# Patient Record
Sex: Female | Born: 1980 | Race: White | Hispanic: No | Marital: Married | Smoking: Never smoker
Health system: Southern US, Community
[De-identification: ages and names within clinical notes are randomized; demographics above are authoritative.]

## PROBLEM LIST (undated history)

## (undated) DIAGNOSIS — F32A Depression, unspecified: Secondary | ICD-10-CM

## (undated) DIAGNOSIS — I509 Heart failure, unspecified: Secondary | ICD-10-CM

## (undated) DIAGNOSIS — F329 Major depressive disorder, single episode, unspecified: Secondary | ICD-10-CM

## (undated) DIAGNOSIS — Z87898 Personal history of other specified conditions: Secondary | ICD-10-CM

---
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
## 2000-03-01 ENCOUNTER — Emergency Department (HOSPITAL_COMMUNITY): Admission: EM | Admit: 2000-03-01 | Discharge: 2000-03-01 | Payer: Self-pay | Admitting: Emergency Medicine

## 2003-11-19 ENCOUNTER — Emergency Department (HOSPITAL_COMMUNITY): Admission: EM | Admit: 2003-11-19 | Discharge: 2003-11-19 | Payer: Self-pay | Admitting: Emergency Medicine

## 2005-12-19 ENCOUNTER — Emergency Department (HOSPITAL_COMMUNITY): Admission: EM | Admit: 2005-12-19 | Discharge: 2005-12-19 | Payer: Self-pay | Admitting: Emergency Medicine

## 2006-04-20 ENCOUNTER — Inpatient Hospital Stay (HOSPITAL_COMMUNITY): Admission: AD | Admit: 2006-04-20 | Discharge: 2006-04-20 | Payer: Self-pay | Admitting: Obstetrics & Gynecology

## 2006-05-21 ENCOUNTER — Emergency Department (HOSPITAL_COMMUNITY): Admission: EM | Admit: 2006-05-21 | Discharge: 2006-05-21 | Payer: Self-pay | Admitting: Emergency Medicine

## 2006-06-30 ENCOUNTER — Ambulatory Visit: Payer: Self-pay | Admitting: Infectious Diseases

## 2006-06-30 ENCOUNTER — Inpatient Hospital Stay (HOSPITAL_COMMUNITY): Admission: AD | Admit: 2006-06-30 | Discharge: 2006-07-02 | Payer: Self-pay | Admitting: Obstetrics & Gynecology

## 2006-11-05 ENCOUNTER — Ambulatory Visit: Payer: Self-pay | Admitting: *Deleted

## 2006-11-05 ENCOUNTER — Inpatient Hospital Stay (HOSPITAL_COMMUNITY): Admission: AD | Admit: 2006-11-05 | Discharge: 2006-11-05 | Payer: Self-pay | Admitting: Obstetrics & Gynecology

## 2006-12-12 ENCOUNTER — Inpatient Hospital Stay (HOSPITAL_COMMUNITY): Admission: AD | Admit: 2006-12-12 | Discharge: 2006-12-17 | Payer: Self-pay | Admitting: Obstetrics and Gynecology

## 2006-12-12 ENCOUNTER — Ambulatory Visit: Payer: Self-pay | Admitting: Obstetrics & Gynecology

## 2007-12-20 ENCOUNTER — Ambulatory Visit: Payer: Self-pay | Admitting: Gastroenterology

## 2008-03-08 ENCOUNTER — Emergency Department (HOSPITAL_COMMUNITY): Admission: EM | Admit: 2008-03-08 | Discharge: 2008-03-08 | Payer: Self-pay | Admitting: Emergency Medicine

## 2009-01-27 ENCOUNTER — Ambulatory Visit: Payer: Self-pay | Admitting: Family Medicine

## 2009-01-27 DIAGNOSIS — L659 Nonscarring hair loss, unspecified: Secondary | ICD-10-CM | POA: Insufficient documentation

## 2009-01-27 DIAGNOSIS — R5383 Other fatigue: Secondary | ICD-10-CM

## 2009-01-27 DIAGNOSIS — R42 Dizziness and giddiness: Secondary | ICD-10-CM | POA: Insufficient documentation

## 2009-01-27 DIAGNOSIS — R5381 Other malaise: Secondary | ICD-10-CM | POA: Insufficient documentation

## 2009-01-28 LAB — CONVERTED CEMR LAB
Basophils Relative: 0.1 % (ref 0.0–3.0)
Eosinophils Relative: 0.8 % (ref 0.0–5.0)
HCT: 40.1 % (ref 36.0–46.0)
Lymphs Abs: 1.6 10*3/uL (ref 0.7–4.0)
MCV: 88.2 fL (ref 78.0–100.0)
Monocytes Absolute: 0.6 10*3/uL (ref 0.1–1.0)
Neutrophils Relative %: 71.3 % (ref 43.0–77.0)
RBC: 4.55 M/uL (ref 3.87–5.11)
WBC: 7.6 10*3/uL (ref 4.5–10.5)

## 2009-02-15 ENCOUNTER — Other Ambulatory Visit: Admission: RE | Admit: 2009-02-15 | Discharge: 2009-02-15 | Payer: Self-pay | Admitting: Family Medicine

## 2009-02-15 ENCOUNTER — Encounter: Payer: Self-pay | Admitting: Family Medicine

## 2009-02-15 ENCOUNTER — Ambulatory Visit: Payer: Self-pay | Admitting: Family Medicine

## 2009-02-15 DIAGNOSIS — IMO0002 Reserved for concepts with insufficient information to code with codable children: Secondary | ICD-10-CM | POA: Insufficient documentation

## 2010-10-24 ENCOUNTER — Encounter: Payer: Self-pay | Admitting: Family Medicine

## 2011-02-17 NOTE — Discharge Summary (Signed)
Sherry Shaw, Sherry Shaw                ACCOUNT NO.:  [id number]   MEDICAL RECORD NO.:  [id number]          PATIENT TYPE:  INP   LOCATION:  9133                          FACILITY:  [facility]   PHYSICIAN:  Phil D. Okey Dupre, M.D.     DATE OF BIRTH:  07-27-1981   DATE OF ADMISSION:  12/12/2006  DATE OF DISCHARGE:  12/17/2006                               DISCHARGE SUMMARY   The patient was admitted on December 12, 2006 at 33 weeks' gestation as a  primigravida, G1, P1 at 65 week's and 0 days. A 30 year old A1  gestational diabetic for induction of labor. At that time, the patient  had been seen. Prenatal care had been received with Dr. Shawnie Pons at [facility]. However, the patient presented as  transfer of care to [facility] for delivery on December 12, 2006 at  the request of Dr. Shawnie Pons. Cervidil induction was began on December 13, 2006,  and at that time, the patient's fetal heart rate was 150s, reassuring,  and the patient was not having contractions. The patient was given  several doses of Cervidil and progressed to 2 cm dilation at which time  on hospital day #2 Pitocin was started. On hospital day #2, the patient  progressed to complete dilation and began pushing. After approximately 1-  1/2 hours of pushing without descent, decision was made to proceed with  a primary low transverse cesarean section for failure to progress, and  low transverse cesarean section was performed by Dr. Marice Potter. Please see  dictated operative note for details of that surgery. Estimated blood  loss was 600 mL. A live female was birthed with Apgars of 9 and 9 at one  and five minutes, respectively, weighing 8 pounds and 9 ounces. The  patient had an uneventful postoperative course and remained afebrile,  and vital signs were stable throughout. The patient continued to normal  exam throughout her stay and no spikes in temperature or labs. The  patient was discharged on hospital day #3. The patient was  discharged on  hospital day #3. Date December 17, 2006 with a normal exam. The patient's  blood sugars and blood pressures remained stable throughout her hospital  course. Discharge labs included a hemoglobin of 7.8, hematocrit of 24.5,  white blood cell count of 12.8, and a platelet count of 295. Of note, at  the time of discharge, a mild cellulitis was observed of patient's  incision line, and the patient was started on Keflex 500 mg b.i.d. for  10 days. At the time of discharge, the patient was a 30 year old G1, P72-  0-0-1 status post primary low transverse cesarean section secondary to  arrest of descent who had a female infant who was grossly normal and was  discharged home with mother. The patient was breast and bottle feeding  and was found to be rubella immune. The patient was immunized against  rubella before discharge. The patient's choice of contraceptive was Depo  which she received 150 mg IM injection prior to discharge from the  hospital.   The patient's discharge prescriptions included:  1. Ibuprofen 600 mg p.o. q.6h. p.r.n. pain.  2. Percocet 3 and 325 one to two tablets q.4-6h. p.r.n. pain.  3. Keflex 500 mg p.o. b.i.d. for 10 days.   Maternal care coordinator will discontinue the staples on postpartum day  #5.      Maylon Cos, C.N.M.    ______________________________  Javier Glazier Okey Dupre, M.D.   SS/MEDQ  D:  02/06/2007  T:  02/06/2007  Job:  [id number]

## 2011-02-17 NOTE — Discharge Summary (Signed)
NAMEAMALA, Sherry Shaw                ACCOUNT NO.:  [id number]   MEDICAL RECORD NO.:  [id number]          PATIENT TYPE:  INP   LOCATION:  9307                          [facility]ILITY[facility]  [facility]   PHYSICIAN:  Lesly Dukes, M.D. DATE OF BIRTH:  08/18/1981   DATE OF ADMISSION:  06/30/2006  DATE OF DISCHARGE:  07/02/2006                                 DISCHARGE SUMMARY   ADMISSION DIAGNOSES:  1. Dehydration.  2. Viral exanthem.   DISCHARGE DIAGNOSES:  1. Dehydration.  2. Viral exanthem.   PROCEDURE:  None.   CONSULTATIONS:  Infectious disease.   HISTORY OF PRESENT ILLNESS:  The patient is a 30 year old, gravida 1 para 0,  at 65 weeks' gestation who presented with dizziness for two days prior to  admission.  The patient also complained of nausea, vomiting, some abdominal  cramping, denies any dysuria, fevers, diarrhea, complains of a generalized  pruritic rash to neck, chest, back, arms x1 week.  The patient receives  prenatal care in [facility].   OUTPATIENT MEDICATIONS:  Prenatal vitamins and Tylenol.   ALLERGIES:  NO KNOWN DRUG ALLERGIES.   PAST MEDICAL HISTORY:   OBSTETRICAL HISTORY:  The patient is a primigravida.   GYNECOLOGIC HISTORY:  No history of STDs.   MEDICAL HISTORY:  Denies any medical problems.   SURGICAL HISTORY:  Denies any surgeries.   SOCIAL HISTORY:  Denies any smoking, alcohol, or drug use.   PHYSICAL EXAMINATION:  VITAL SIGNS:  Included a temperature of 98.6, pulse  104, respirations 22, blood pressure 107/67.  GENERAL:  She appeared in no apparent distress, poor pallor, pasty-  appearing.  HEENT:  Significant for anterior and posterior cervical lymphadenopathy.  LUNGS:  Clear to auscultation bilaterally.  NECK:  There was no nuchal rigidity or stiffness.  HEART:  Regular rate and rhythm.  ABDOMEN:  Soft.  She has mild generalized tenderness.  No rebound or  guarding.  SKIN:  There is a diffuse papular rash on chest, back, upper arms.  EXTREMITIES:   No edema.  Full range of motion.   ADMISSION LABORATORY:  Included a white count of 16.2, hemoglobin 11.8,  platelets 388.  Glucose 51, potassium 3.4, creatinine 0.4.  ALT 17, AST 19.   The patient was admitted with dehydration and viral exanthem.  Infectious  disease was consulted.  The patient was admitted for observation, started on  IV fluids, antiemetics, and Benadryl p.r.n. for her rash.   HOSPITAL COURSE:  Infectious disease saw the patient, thought most likely  viral exanthem.  CMV, toxo, EBV titers were sent.  The patient's dehydration  improved.  By hospital day one, she denied any nausea, vomiting, no  dizziness.  Abdominal exam was improved.  Her toxo titers came back negative  both IgG and IgM.  The patient still had significant itching, was switched  to Vistaril with improvement.  The patient was discharged in stable  condition on hospital day two.   The patient was discharged on the following medication, hydroxyzine 25 mg  p.o. q.6 h. p.r.n. itching.   DIET:  Regular.   ACTIVITY:  No restrictions.   The patient to follow up with her OB doctor in [facility], has an  appointment scheduled on July 10, 2006.   At the time of discharge both CMV and EBV titers were still pending.  The  patient to have private physician follow up on results.     ______________________________  Paticia Stack, MD    ______________________________  Lesly Dukes, M.D.    LNJ/MEDQ  D:  07/17/2006  T:  07/18/2006  Job:  [id number]   cc:   patient's private physician  [place], [place]

## 2011-02-17 NOTE — Op Note (Signed)
Sherry Shaw, Sherry Shaw                ACCOUNT NO.:  [id number]   MEDICAL RECORD NO.:  [id number]          PATIENT TYPE:  INP   LOCATION:  9133                          [facility]ILITY[facility]  [facility]   PHYSICIAN:  Paticia Stack, MD     DATE OF BIRTH:  18-Aug-1981   DATE OF PROCEDURE:  12/14/2006  DATE OF DISCHARGE:                               OPERATIVE REPORT   PREOPERATIVE DIAGNOSIS:  Intrauterine pregnancy at 75 weeks' gestation,  arrest of labor, gestational diabetes.   POSTOPERATIVE DIAGNOSIS:  Intrauterine pregnancy at 38 weeks' gestation,  arrest of labor, gestational diabetes.   PROCEDURE:  Primary low transverse cesarean section.   SURGEON:  Dr. Nicholaus Bloom and Dr. Sherron Monday.   ASSISTANT:  Dr. Wilburt Finlay.   ANESTHESIA:  Epidural and local.   SPECIMEN:  Placenta sent to L&D.   ESTIMATED BLOOD LOSS:  600 mL.   COMPLICATIONS:  None.   FINDINGS:  Living female infant, Apgars 9 and 9 at one and five minutes,  respectively.  Birth weight 8 pounds 9 ounces.   INDICATIONS FOR PROCEDURE:  This is a 30 year old gravida 1 at 85 weeks'  gestation with history of gestational diabetes who was admitted for  induction of labor secondary to gestational diabetes and postdates.  The  patient progressed to complete and pushing for greater than 1-1/2 hours  with no descent from +1 station.  The patient was taken for primary low  transverse cesarean section secondary to arrest of descent.   PROCEDURE:  The patient was taken to the operating room where her  epidural anesthesia was found to be adequate.  She was then prepped and  draped in a normal sterile fashion in dorsal supine position with a  leftward tilt.  Pfannenstiel skin incision was then made with a scalpel  and carried through to the underlying layer of fascia.  Fascia was  incised in the midline, and the incision extended laterally with Mayo  scissors.  The superior aspect of the fascial incision was then grasped  with the Kocher clamps,  elevated and the underlying rectus muscles  dissected off bluntly.  Attention was then turned to the inferior aspect  of the incision which in a similar fashion was grasped, tented up with  the Kocher clamps and the rectus muscles dissected off bluntly.  The  rectus muscles were then separated in the midline and the peritoneum  identified, tented up and entered sharply with the Metzenbaum scissors.  The peritoneal incision was then extended superiorly and inferiorly with  good visualization of the bladder.  The bladder blade was then inserted  and the vesicouterine peritoneum identified, grasped with the pickups  and entered sharply with the Metzenbaum scissors.  This incision was  then extended laterally and the bladder flap created digitally.  The  bladder blade was then reinserted and the lower uterine segment incised  in a transverse fashion with a scalpel.  The bladder blade was removed.  The infant was found to be in the OP position, and the infant's head was  delivered atraumatically.  The nose and mouth were  suctioned and the  cord clamped and cut.  The infant was handed off to the waiting  pediatrician.  The placenta was then removed manually.  The uterus  cleared of all clots and debris, and the uterine incision was repaired  with 0 Monocryl in a running locked fashion.  The uterine incision was  inspected several times with good hemostasis noted.  The gutters were  cleared of all clots, and the fascia was reapproximated with 0 Vicryl in  a running fashion.  The skin was closed with staples.  The patient  tolerated the procedure well.  Sponge, lap and needle counts were  correct x2.  Two grams of Ancef was given at cord clamp.  The patient  was taken to the recovery room in stable condition.           ______________________________  Paticia Stack, MD     LNJ/MEDQ  D:  12/14/2006  T:  12/15/2006  Job:  [id number]

## 2011-06-29 LAB — POCT I-STAT, CHEM 8
BUN: 11
Calcium, Ion: 1.14
Chloride: 105
Creatinine, Ser: 0.7
Glucose, Bld: 96
HCT: 40
Hemoglobin: 13.6
Potassium: 3.7
Sodium: 138
TCO2: 23

## 2011-07-04 ENCOUNTER — Emergency Department (HOSPITAL_COMMUNITY)
Admission: EM | Admit: 2011-07-04 | Discharge: 2011-07-04 | Disposition: A | Discharge status: Home / Self Care | Payer: 59 | Attending: Emergency Medicine | Admitting: Emergency Medicine

## 2011-07-04 DIAGNOSIS — R040 Epistaxis: Secondary | ICD-10-CM | POA: Insufficient documentation

## 2012-04-10 NOTE — H&P (Signed)
Sherry Shaw  DICTATION # [id number] CSN# [id number]   Meriel Pica, MD 04/10/2012 1:03 PM

## 2012-04-11 NOTE — H&P (Signed)
NAMEPAYTON, MODER NO.:  [id number]  MEDICAL RECORD NO.:  [id number]  LOCATION:                                 FACILITY:  PHYSICIAN:  Duke Salvia. Marcelle Overlie, M.D.DATE OF BIRTH:  [birth year]  DATE OF ADMISSION: DATE OF DISCHARGE:                             HISTORY & PHYSICAL   CHIEF COMPLAINT:  Pelvic pain, dyspareunia.  HISTORY OF PRESENT ILLNESS:  A 31 year old, G2, P1, currently using Depo- Provera for contraception.  She has a 75-month history of insertional and deep dyspareunia that has worsened during that time frame.  Ultrasound on Mar 01, 2012, in our office showed a retroverted uterus with no other significant abnormalities.  She has required Vicoprofen for relief of her pain, and presents now for diagnostic laparoscopy to further evaluate.  This procedure including specific risks related to bleeding, infection, adjacent organ injury, the possible need for open additional surgery discussed with her.  The laparoscopic treatment of endometriosis, lysis of adhesions was discussed with her also.  PAST MEDICAL HISTORY:  Allergies to AMPICILLIN.  CURRENT MEDICATIONS:  Wellbutrin, Prozac, Vicoprofen p.r.n.  SURGICAL HISTORY:  Cesarean section in 2008.  REVIEW OF SYSTEMS:  Significant for headache, anemia, UTI.  FAMILY HISTORY:  Significant for asthma, arthritis, diabetes, and lung and throat cancer.  SOCIAL HISTORY:  Denies drug or tobacco use.  She does admit to social alcohol use.  She is married.  PHYSICAL EXAMINATION:  VITAL SIGNS:  Temp 98.2, blood pressure 120/60. HEENT:  Unremarkable. NECK:  Supple without masses. LUNGS:  Clear. CARDIOVASCULAR:  Regular rate and rhythm without murmurs, rubs, or gallops. BREASTS:  Without masses. ABDOMEN:  Soft, flat, nontender. PELVIC:  Normal external genitalia.  Vagina and cervix clear.  Uterus, mid position, normal size, mildly tender.  No unusual nodularity. Adnexa negative. EXTREMITIES:   Unremarkable. NEUROLOGIC:  Unremarkable.  IMPRESSION:  Dyspareunia, rule out endometriosis, adhesions or other causes of pelvic pain.  PLAN:  Diagnostic laparoscopy.  Procedure and risks discussed as above.     Jonise Weightman M. Marcelle Overlie, M.D.     RMH/MEDQ  D:  04/10/2012  T:  04/11/2012  Job:  [id number]

## 2012-04-12 ENCOUNTER — Encounter (HOSPITAL_COMMUNITY): Payer: Self-pay | Admitting: Pharmacist

## 2012-04-16 ENCOUNTER — Encounter (HOSPITAL_COMMUNITY): Payer: Self-pay

## 2012-04-16 ENCOUNTER — Encounter (HOSPITAL_COMMUNITY)
Admission: RE | Admit: 2012-04-16 | Discharge: 2012-04-16 | Disposition: A | Discharge status: Home / Self Care | Payer: 59 | Source: Ambulatory Visit | Attending: Obstetrics and Gynecology | Admitting: Obstetrics and Gynecology

## 2012-04-16 HISTORY — DX: Major depressive disorder, single episode, unspecified: F32.9

## 2012-04-16 HISTORY — DX: Personal history of other specified conditions: Z87.898

## 2012-04-16 HISTORY — DX: Depression, unspecified: F32.A

## 2012-04-16 LAB — CBC
HCT: 39.5 % (ref 36.0–46.0)
Hemoglobin: 13 g/dL (ref 12.0–15.0)
MCHC: 32.9 g/dL (ref 30.0–36.0)
RBC: 4.61 MIL/uL (ref 3.87–5.11)
WBC: 9.4 10*3/uL (ref 4.0–10.5)

## 2012-04-16 LAB — SURGICAL PCR SCREEN: Staphylococcus aureus: POSITIVE — AB

## 2012-04-16 NOTE — Patient Instructions (Signed)
YOUR PROCEDURE IS SCHEDULED ON:04/25/12  ENTER THROUGH THE MAIN ENTRANCE OF [facility] AT:6am  USE DESK PHONE AND DIAL [phone] TO INFORM us OF YOUR ARRIVAL  CALL [phone] IF YOU HAVE ANY QUESTIONS OR PROBLEMS PRIOR TO YOUR ARRIVAL.  REMEMBER: DO NOT EAT OR DRINK AFTER MIDNIGHT : Wed   YOU MAY BRUSH YOUR TEETH THE MORNING OF SURGERY   TAKE THESE MEDICINES THE DAY OF SURGERY WITH SIP OF WATER:none   DO NOT WEAR JEWELRY, EYE MAKEUP, LIPSTICK OR DARK FINGERNAIL POLISH DO NOT WEAR LOTIONS  DO NOT SHAVE FOR 48 HOURS PRIOR TO SURGERY  YOU WILL NOT BE ALLOWED TO DRIVE YOURSELF HOME.

## 2012-04-18 MED ORDER — CEFAZOLIN SODIUM-DEXTROSE 2-3 GM-% IV SOLR
INTRAVENOUS | Status: AC
  Filled 2012-04-18: qty 50

## 2012-04-24 MED ORDER — CIPROFLOXACIN IN D5W 400 MG/200ML IV SOLN
400.0000 mg | INTRAVENOUS | Status: AC
  Administered 2012-04-25: 400 mg via INTRAVENOUS
  Filled 2012-04-24: qty 200

## 2012-04-24 MED ORDER — METRONIDAZOLE IN NACL 5-0.79 MG/ML-% IV SOLN
500.0000 mg | INTRAVENOUS | Status: AC
  Administered 2012-04-25: .5 g via INTRAVENOUS
  Filled 2012-04-24: qty 100

## 2012-04-25 ENCOUNTER — Ambulatory Visit (HOSPITAL_COMMUNITY)
Admission: RE | Admit: 2012-04-25 | Discharge: 2012-04-25 | Disposition: A | Discharge status: Home / Self Care | Payer: 59 | Source: Ambulatory Visit | Attending: Obstetrics and Gynecology | Admitting: Obstetrics and Gynecology

## 2012-04-25 ENCOUNTER — Encounter (HOSPITAL_COMMUNITY): Payer: Self-pay | Admitting: *Deleted

## 2012-04-25 ENCOUNTER — Encounter (HOSPITAL_COMMUNITY): Payer: Self-pay | Admitting: Anesthesiology

## 2012-04-25 ENCOUNTER — Encounter (HOSPITAL_COMMUNITY)
Admission: RE | Disposition: A | Discharge status: Home / Self Care | Payer: Self-pay | Source: Ambulatory Visit | Attending: Obstetrics and Gynecology

## 2012-04-25 ENCOUNTER — Ambulatory Visit (HOSPITAL_COMMUNITY): Payer: 59 | Admitting: Anesthesiology

## 2012-04-25 DIAGNOSIS — Z01818 Encounter for other preprocedural examination: Secondary | ICD-10-CM | POA: Insufficient documentation

## 2012-04-25 DIAGNOSIS — Z01812 Encounter for preprocedural laboratory examination: Secondary | ICD-10-CM | POA: Insufficient documentation

## 2012-04-25 DIAGNOSIS — N949 Unspecified condition associated with female genital organs and menstrual cycle: Secondary | ICD-10-CM | POA: Insufficient documentation

## 2012-04-25 DIAGNOSIS — IMO0002 Reserved for concepts with insufficient information to code with codable children: Secondary | ICD-10-CM | POA: Insufficient documentation

## 2012-04-25 HISTORY — PX: LAPAROSCOPY: SHX197

## 2012-04-25 SURGERY — LAPAROSCOPY, DIAGNOSTIC
Anesthesia: General | Site: Abdomen | Wound class: Clean

## 2012-04-25 MED ORDER — NEOSTIGMINE METHYLSULFATE 1 MG/ML IJ SOLN
INTRAMUSCULAR | Status: AC
  Filled 2012-04-25: qty 10

## 2012-04-25 MED ORDER — MIDAZOLAM HCL 2 MG/2ML IJ SOLN
INTRAMUSCULAR | Status: AC
  Filled 2012-04-25: qty 2

## 2012-04-25 MED ORDER — BUPIVACAINE HCL (PF) 0.25 % IJ SOLN
INTRAMUSCULAR | Status: DC | PRN
  Administered 2012-04-25: 8 mL

## 2012-04-25 MED ORDER — FENTANYL CITRATE 0.05 MG/ML IJ SOLN
INTRAMUSCULAR | Status: AC
  Filled 2012-04-25: qty 5

## 2012-04-25 MED ORDER — FENTANYL CITRATE 0.05 MG/ML IJ SOLN
INTRAMUSCULAR | Status: DC | PRN
  Administered 2012-04-25: 100 ug via INTRAVENOUS
  Administered 2012-04-25: 150 ug via INTRAVENOUS

## 2012-04-25 MED ORDER — ROCURONIUM BROMIDE 50 MG/5ML IV SOLN
INTRAVENOUS | Status: AC
  Filled 2012-04-25: qty 1

## 2012-04-25 MED ORDER — SODIUM CHLORIDE 0.9 % IJ SOLN
INTRAMUSCULAR | Status: DC | PRN
  Administered 2012-04-25: 10 mL

## 2012-04-25 MED ORDER — KETOROLAC TROMETHAMINE 30 MG/ML IJ SOLN
INTRAMUSCULAR | Status: AC
  Administered 2012-04-25: 30 mg via INTRAVENOUS
  Filled 2012-04-25: qty 1

## 2012-04-25 MED ORDER — ONDANSETRON HCL 4 MG/2ML IJ SOLN
INTRAMUSCULAR | Status: AC
  Filled 2012-04-25: qty 2

## 2012-04-25 MED ORDER — FENTANYL CITRATE 0.05 MG/ML IJ SOLN
INTRAMUSCULAR | Status: AC
  Administered 2012-04-25: 50 ug via INTRAVENOUS
  Filled 2012-04-25: qty 2

## 2012-04-25 MED ORDER — LIDOCAINE HCL (CARDIAC) 20 MG/ML IV SOLN
INTRAVENOUS | Status: DC | PRN
  Administered 2012-04-25: 30 mg via INTRAVENOUS

## 2012-04-25 MED ORDER — HYDROCODONE-ACETAMINOPHEN 5-325 MG PO TABS
1.0000 | ORAL_TABLET | Freq: Once | ORAL | Status: AC
  Administered 2012-04-25: 1 via ORAL

## 2012-04-25 MED ORDER — KETOROLAC TROMETHAMINE 30 MG/ML IJ SOLN
30.0000 mg | Freq: Once | INTRAMUSCULAR | Status: AC
  Administered 2012-04-25: 30 mg via INTRAVENOUS

## 2012-04-25 MED ORDER — FENTANYL CITRATE 0.05 MG/ML IJ SOLN
25.0000 ug | INTRAMUSCULAR | Status: DC | PRN
  Administered 2012-04-25: 50 ug via INTRAVENOUS

## 2012-04-25 MED ORDER — PROPOFOL 10 MG/ML IV EMUL
INTRAVENOUS | Status: AC
  Filled 2012-04-25: qty 20

## 2012-04-25 MED ORDER — MIDAZOLAM HCL 5 MG/5ML IJ SOLN
INTRAMUSCULAR | Status: DC | PRN
  Administered 2012-04-25: 2 mg via INTRAVENOUS

## 2012-04-25 MED ORDER — LACTATED RINGERS IV SOLN
INTRAVENOUS | Status: DC
  Administered 2012-04-25 (×2): via INTRAVENOUS

## 2012-04-25 MED ORDER — DEXAMETHASONE SODIUM PHOSPHATE 10 MG/ML IJ SOLN
INTRAMUSCULAR | Status: AC
  Filled 2012-04-25: qty 1

## 2012-04-25 MED ORDER — DEXAMETHASONE SODIUM PHOSPHATE 10 MG/ML IJ SOLN
INTRAMUSCULAR | Status: DC | PRN
  Administered 2012-04-25: 10 mg via INTRAVENOUS

## 2012-04-25 MED ORDER — METOCLOPRAMIDE HCL 5 MG/ML IJ SOLN: 10.0000 mg | Freq: Once | INTRAMUSCULAR | Status: DC | PRN

## 2012-04-25 MED ORDER — HYDROMORPHONE HCL PF 1 MG/ML IJ SOLN: 0.2500 mg | INTRAMUSCULAR | Status: DC | PRN

## 2012-04-25 MED ORDER — PROPOFOL 10 MG/ML IV EMUL
INTRAVENOUS | Status: DC | PRN
  Administered 2012-04-25: 150 mg via INTRAVENOUS
  Administered 2012-04-25: 50 mg via INTRAVENOUS

## 2012-04-25 MED ORDER — NEOSTIGMINE METHYLSULFATE 1 MG/ML IJ SOLN
INTRAMUSCULAR | Status: DC | PRN
  Administered 2012-04-25: 4 mg via INTRAVENOUS

## 2012-04-25 MED ORDER — BUPIVACAINE HCL (PF) 0.25 % IJ SOLN
INTRAMUSCULAR | Status: AC
  Filled 2012-04-25: qty 30

## 2012-04-25 MED ORDER — DEXTROSE IN LACTATED RINGERS 5 % IV SOLN: INTRAVENOUS | Status: DC

## 2012-04-25 MED ORDER — GLYCOPYRROLATE 0.2 MG/ML IJ SOLN
INTRAMUSCULAR | Status: DC | PRN
  Administered 2012-04-25: .8 mg via INTRAVENOUS

## 2012-04-25 MED ORDER — ONDANSETRON HCL 4 MG/2ML IJ SOLN
INTRAMUSCULAR | Status: DC | PRN
  Administered 2012-04-25: 4 mg via INTRAVENOUS

## 2012-04-25 MED ORDER — GLYCOPYRROLATE 0.2 MG/ML IJ SOLN
INTRAMUSCULAR | Status: AC
  Filled 2012-04-25: qty 2

## 2012-04-25 MED ORDER — ROCURONIUM BROMIDE 100 MG/10ML IV SOLN
INTRAVENOUS | Status: DC | PRN
  Administered 2012-04-25: 30 mg via INTRAVENOUS

## 2012-04-25 MED ORDER — MEPERIDINE HCL 25 MG/ML IJ SOLN: 6.2500 mg | INTRAMUSCULAR | Status: DC | PRN

## 2012-04-25 MED ORDER — HYDROCODONE-ACETAMINOPHEN 5-325 MG PO TABS
ORAL_TABLET | ORAL | Status: AC
  Filled 2012-04-25: qty 1

## 2012-04-25 MED ORDER — LIDOCAINE HCL (CARDIAC) 20 MG/ML IV SOLN
INTRAVENOUS | Status: AC
  Filled 2012-04-25: qty 5

## 2012-04-25 SURGICAL SUPPLY — 31 items
ADH SKN CLS APL DERMABOND .7 (GAUZE/BANDAGES/DRESSINGS) ×2
BAG SPEC RTRVL LRG 6X4 10 (ENDOMECHANICALS)
CABLE HIGH FREQUENCY MONO STRZ (ELECTRODE) IMPLANT
CATH ROBINSON RED A/P 16FR (CATHETERS) ×3 IMPLANT
CLOTH BEACON ORANGE TIMEOUT ST (SAFETY) ×3 IMPLANT
DECANTER SPIKE VIAL GLASS SM (MISCELLANEOUS) ×4 IMPLANT
DERMABOND ADVANCED (GAUZE/BANDAGES/DRESSINGS) ×1
DERMABOND ADVANCED .7 DNX12 (GAUZE/BANDAGES/DRESSINGS) ×2 IMPLANT
GLOVE BIO SURGEON STRL SZ7 (GLOVE) ×6 IMPLANT
GLOVE BIOGEL PI IND STRL 6.5 (GLOVE) ×1 IMPLANT
GLOVE BIOGEL PI INDICATOR 6.5 (GLOVE) ×1
GLOVE NEODERM STER SZ 7 (GLOVE) ×2 IMPLANT
GOWN PREVENTION PLUS LG XLONG (DISPOSABLE) ×6 IMPLANT
NDL INSUFFLATION 14GA 120MM (NEEDLE) ×1 IMPLANT
NEEDLE INSUFFLATION 14GA 120MM (NEEDLE) ×3 IMPLANT
NS IRRIG 1000ML POUR BTL (IV SOLUTION) ×1 IMPLANT
PACK LAPAROSCOPY BASIN (CUSTOM PROCEDURE TRAY) ×3 IMPLANT
POUCH SPECIMEN RETRIEVAL 10MM (ENDOMECHANICALS) IMPLANT
PROTECTOR NERVE ULNAR (MISCELLANEOUS) ×3 IMPLANT
SEALER TISSUE G2 CVD JAW 35 (ENDOMECHANICALS) IMPLANT
SEALER TISSUE G2 CVD JAW 45CM (ENDOMECHANICALS) IMPLANT
SET IRRIG TUBING LAPAROSCOPIC (IRRIGATION / IRRIGATOR) IMPLANT
SUT MON AB 2-0 CT1 36 (SUTURE) ×3 IMPLANT
SUT VIC AB 2-0 CT1 18 (SUTURE) ×2 IMPLANT
SUT VICRYL 0 TIES 12 18 (SUTURE) ×1 IMPLANT
SUT VICRYL RAPIDE 4/0 PS 2 (SUTURE) ×6 IMPLANT
TOWEL OR 17X24 6PK STRL BLUE (TOWEL DISPOSABLE) ×6 IMPLANT
TROCAR Z-THREAD BLADED 11X100M (TROCAR) ×3 IMPLANT
TROCAR Z-THREAD BLADED 5X100MM (TROCAR) ×4 IMPLANT
WARMER LAPAROSCOPE (MISCELLANEOUS) ×3 IMPLANT
WATER STERILE IRR 1000ML POUR (IV SOLUTION) ×3 IMPLANT

## 2012-04-25 NOTE — Anesthesia Preprocedure Evaluation (Addendum)
Anesthesia Evaluation  Patient identified by MRN, date of birth, ID band Patient awake    Reviewed: Allergy & Precautions, H&P , NPO status , Patient's Chart, lab work & pertinent test results  Airway Mallampati: II TM Distance: >3 FB Neck ROM: Full    Dental No notable dental hx. (+) Chipped   Pulmonary neg pulmonary ROS,  breath sounds clear to auscultation  Pulmonary exam normal       Cardiovascular negative cardio ROS  Rhythm:Regular Rate:Normal     Neuro/Psych Depression negative neurological ROS     GI/Hepatic Neg liver ROS, GERD-  Medicated and Controlled,  Endo/Other  negative endocrine ROS  Renal/GU negative Renal ROS  negative genitourinary   Musculoskeletal negative musculoskeletal ROS (+)   Abdominal   Peds  Hematology negative hematology ROS (+)   Anesthesia Other Findings   Reproductive/Obstetrics negative OB ROS                          Anesthesia Physical Anesthesia Plan  ASA: II  Anesthesia Plan: General   Post-op Pain Management:    Induction: Intravenous  Airway Management Planned: Oral ETT  Additional Equipment:   Intra-op Plan:   Post-operative Plan: Extubation in OR  Informed Consent: I have reviewed the patients History and Physical, chart, labs and discussed the procedure including the risks, benefits and alternatives for the proposed anesthesia with the patient or authorized representative who has indicated his/her understanding and acceptance.   Dental advisory given  Plan Discussed with: CRNA, Anesthesiologist and Surgeon  Anesthesia Plan Comments:         Anesthesia Quick Evaluation

## 2012-04-25 NOTE — Transfer of Care (Signed)
Immediate Anesthesia Transfer of Care Note  Patient: Sherry Shaw  Procedure(s) Performed: Procedure(s) (LRB): LAPAROSCOPY DIAGNOSTIC (N/A)  Patient Location: PACU  Anesthesia Type: General  Level of Consciousness: awake  Airway & Oxygen Therapy: Patient Spontanous Breathing and Patient connected to nasal cannula oxygen  Post-op Assessment: Report given to PACU RN and Post -op Vital signs reviewed and stable  Post vital signs: stable  Complications: No apparent anesthesia complications

## 2012-04-25 NOTE — Op Note (Signed)
Preoperative diagnosis: Pelvic pain, dyspareunia  Postoperative diagnosis: Same, prominent pelvic vasculature, possible mild early stage endometriosis  Procedure: Diagnostic laparoscopy  Surgeon: Marcelle Overlie  Specimens removed: None  Drains: In and out Foley catheter  Complications: None  Procedure and findings:  The patient taken the operating room after an adequate level of general anesthesia was obtained with the patient's legs in stirrups the abdomen perineum and vagina were prepped and draped in the usual fashion for laparoscopy. Appropriate timeout for taken at that point. The bladder had been drained in a Hulka tenaculum position appropriate for a posterior uterus, EUA was otherwise normal except for a posteriorly positioned uterus. Attention directed to the abdomen where 2 cm subumbilical incision was made after infiltrating with quarter percent Marcaine plain the varies needle was then introduced without difficulty, its intra-abdominal position verified by pressure water testing. After a 3 L pneumoperitoneum syncopated lap scopic trocar and sleeve were then introduced without difficulty. There was no evidence of any bleeding or trauma, 3 finger breaths above the symphysis in the midline a 5 mm trocar was inserted under direct visualization and the pelvic findings as follows:  The uterus itself is retroflexed normal size the serosa appeared to be normal anteriorly there was a small peritoneal window was some minimal scarring on the left bladder flap area but no other areas of active endometriosis noted posterior cul-de-sac was free and clear there were some prominent pelvic vasculature on each side of the broad ligament bilateral tubes and ovaries were otherwise normal along the left pelvic sidewall there were some early whitish possible endometriosis these areas were not biopsied this looked very minimal the appendix and upper abdomen were otherwise unremarkable pictures were taken after this  was photo documented is was removed gas allowed to escape defect closed with 4-0 Vicryl subcuticular at the umbilicus and Dermabond below. Patient tolerated this well went to recovery room in good condition.  Dictated with [facility]  Sakia Schrimpf M. Milana Obey.D.

## 2012-04-25 NOTE — Progress Notes (Signed)
The patient was re-examined with no change in status 

## 2012-04-25 NOTE — Anesthesia Postprocedure Evaluation (Signed)
Anesthesia Post Note  Patient: Sherry Shaw  Procedure(s) Performed: Procedure(s) (LRB): LAPAROSCOPY DIAGNOSTIC (N/A)  Anesthesia type: General  Patient location: PACU  Post pain: Pain level controlled  Post assessment: Post-op Vital signs reviewed  Last Vitals:  Filed Vitals:   04/25/12 0930  BP: 101/67  Pulse: 76  Temp: 36.4 C  Resp: 16    Post vital signs: Reviewed  Level of consciousness: sedated  Complications: No apparent anesthesia complications \

## 2012-04-26 ENCOUNTER — Encounter (HOSPITAL_COMMUNITY): Payer: Self-pay | Admitting: Obstetrics and Gynecology

## 2012-09-05 ENCOUNTER — Encounter (HOSPITAL_COMMUNITY): Payer: Self-pay | Admitting: *Deleted

## 2012-09-05 ENCOUNTER — Emergency Department (HOSPITAL_COMMUNITY): Payer: 59

## 2012-09-05 ENCOUNTER — Emergency Department (HOSPITAL_COMMUNITY)
Admission: EM | Admit: 2012-09-05 | Discharge: 2012-09-05 | Disposition: A | Discharge status: Home / Self Care | Payer: 59 | Attending: Emergency Medicine | Admitting: Emergency Medicine

## 2012-09-05 DIAGNOSIS — R5381 Other malaise: Secondary | ICD-10-CM | POA: Insufficient documentation

## 2012-09-05 DIAGNOSIS — R059 Cough, unspecified: Secondary | ICD-10-CM | POA: Insufficient documentation

## 2012-09-05 DIAGNOSIS — R197 Diarrhea, unspecified: Secondary | ICD-10-CM | POA: Insufficient documentation

## 2012-09-05 DIAGNOSIS — R05 Cough: Secondary | ICD-10-CM | POA: Insufficient documentation

## 2012-09-05 DIAGNOSIS — Z8679 Personal history of other diseases of the circulatory system: Secondary | ICD-10-CM | POA: Insufficient documentation

## 2012-09-05 DIAGNOSIS — R109 Unspecified abdominal pain: Secondary | ICD-10-CM | POA: Insufficient documentation

## 2012-09-05 DIAGNOSIS — F3289 Other specified depressive episodes: Secondary | ICD-10-CM | POA: Insufficient documentation

## 2012-09-05 DIAGNOSIS — R630 Anorexia: Secondary | ICD-10-CM | POA: Insufficient documentation

## 2012-09-05 DIAGNOSIS — J3489 Other specified disorders of nose and nasal sinuses: Secondary | ICD-10-CM | POA: Insufficient documentation

## 2012-09-05 DIAGNOSIS — R509 Fever, unspecified: Secondary | ICD-10-CM | POA: Insufficient documentation

## 2012-09-05 DIAGNOSIS — J029 Acute pharyngitis, unspecified: Secondary | ICD-10-CM | POA: Insufficient documentation

## 2012-09-05 DIAGNOSIS — F329 Major depressive disorder, single episode, unspecified: Secondary | ICD-10-CM | POA: Insufficient documentation

## 2012-09-05 DIAGNOSIS — J111 Influenza due to unidentified influenza virus with other respiratory manifestations: Secondary | ICD-10-CM | POA: Insufficient documentation

## 2012-09-05 DIAGNOSIS — Z79899 Other long term (current) drug therapy: Secondary | ICD-10-CM | POA: Insufficient documentation

## 2012-09-05 LAB — POCT I-STAT, CHEM 8
Calcium, Ion: 1.07 mmol/L — ABNORMAL LOW (ref 1.12–1.23)
Glucose, Bld: 81 mg/dL (ref 70–99)
HCT: 32 % — ABNORMAL LOW (ref 36.0–46.0)
Hemoglobin: 10.9 g/dL — ABNORMAL LOW (ref 12.0–15.0)
Potassium: 3.4 mEq/L — ABNORMAL LOW (ref 3.5–5.1)

## 2012-09-05 MED ORDER — ONDANSETRON HCL 4 MG PO TABS: 4.0000 mg | ORAL_TABLET | Freq: Four times a day (QID) | ORAL | Status: AC

## 2012-09-05 MED ORDER — FENTANYL CITRATE 0.05 MG/ML IJ SOLN
50.0000 ug | Freq: Once | INTRAMUSCULAR | Status: AC
  Administered 2012-09-05: 50 ug via INTRAVENOUS
  Filled 2012-09-05: qty 2

## 2012-09-05 MED ORDER — SODIUM CHLORIDE 0.9 % IV BOLUS (SEPSIS)
1000.0000 mL | Freq: Once | INTRAVENOUS | Status: AC
  Administered 2012-09-05: 1000 mL via INTRAVENOUS

## 2012-09-05 NOTE — ED Notes (Signed)
Pt given ginger ale.

## 2012-09-05 NOTE — ED Notes (Signed)
Pt tolerating fluids. Pt states throat still sore and when she coughs she is sore, but otherwise pt doing well.

## 2012-09-05 NOTE — ED Provider Notes (Addendum)
History     CSN: [id number]  Arrival date & time 09/05/12  1707   First MD Initiated Contact with Patient 09/05/12 1719      Chief Complaint  Patient presents with  . Nausea  . Abdominal Pain    (Consider location/radiation/quality/duration/timing/severity/associated sxs/prior treatment) Patient is a 31 y.o. female presenting with abdominal pain and flu symptoms. The history is provided by the patient.  Abdominal Pain The primary symptoms of the illness include fever, fatigue, nausea, vomiting and diarrhea. The primary symptoms of the illness do not include abdominal pain, shortness of breath or dysuria. The current episode started yesterday. The onset of the illness was sudden. The problem has been gradually worsening.  The vomiting began yesterday. Vomiting occurs more than 10 times per day. The emesis contains stomach contents.  The patient states that she believes she is currently not pregnant. Additional symptoms associated with the illness include chills and anorexia. Associated medical issues comments: recently exposed to flu but did have a flu shot.  Influenza This is a new problem. Episode onset: about 1 week. The problem occurs constantly. The problem has been gradually worsening. Pertinent negatives include no chest pain, no abdominal pain and no shortness of breath. Exacerbated by: lying down. Nothing relieves the symptoms. Treatments tried: otc meds  The treatment provided no relief.    Past Medical History  Diagnosis Date  . Depression   . History of heartburn     Past Surgical History  Procedure Date  . Cesarean section   . Laparoscopy 04/25/2012    Procedure: LAPAROSCOPY DIAGNOSTIC;  Surgeon: Meriel Pica, MD;  Location: [facility];  Service: Gynecology;  Laterality: N/A;    No family history on file.  History  Substance Use Topics  . Smoking status: Never Smoker   . Smokeless tobacco: Not on file  . Alcohol Use: 0.6 oz/week    1 Glasses of wine per week      Comment: occasionally    OB History    Grav Para Term Preterm Abortions TAB SAB Ect Mult Living                  Review of Systems  Constitutional: Positive for fever, chills and fatigue.  HENT: Positive for congestion and sore throat.   Respiratory: Positive for cough. Negative for shortness of breath.   Cardiovascular: Negative for chest pain and palpitations.  Gastrointestinal: Positive for nausea, vomiting, diarrhea and anorexia. Negative for abdominal pain.  Genitourinary: Negative for dysuria.  Musculoskeletal:       Earlier her hands were cramping while she was at urgent care  All other systems reviewed and are negative.    Allergies  Amoxicillin and Penicillins  Home Medications   Current Outpatient Rx  Name  Route  Sig  Dispense  Refill  . FLUOXETINE HCL 20 MG PO CAPS   Oral   Take 20 mg by mouth daily.         Marland Kitchen HYDROCODONE-IBUPROFEN 5-200 MG PO TABS   Oral   Take 1 tablet by mouth every 6 (six) hours as needed. For pain         . ONDANSETRON HCL 2 MG/ML IJ SOLN   Intravenous   Inject 4 mg into the vein once.         Marland Kitchen PROMETHAZINE HCL 25 MG/ML IJ SOLN   Intravenous   Inject 25 mg into the vein once.           BP 111/72  Pulse 111  Temp 99.3 F (37.4 C) (Oral)  Resp 16  SpO2 100%  Physical Exam  Nursing note and vitals reviewed. Constitutional: She is oriented to person, place, and time. She appears well-developed and well-nourished. No distress.  HENT:  Head: Normocephalic and atraumatic.  Mouth/Throat: Mucous membranes are dry. Posterior oropharyngeal erythema present. No oropharyngeal exudate or posterior oropharyngeal edema.  Eyes: EOM are normal. Pupils are equal, round, and reactive to light.  Cardiovascular: Regular rhythm, normal heart sounds and intact distal pulses.  Tachycardia present.  Exam reveals no friction rub.   No murmur heard. Pulmonary/Chest: Effort normal and breath sounds normal. She has no wheezes. She has  no rales.  Abdominal: Soft. Bowel sounds are normal. She exhibits no distension. There is no tenderness. There is no rebound and no guarding.  Musculoskeletal: Normal range of motion. She exhibits no tenderness.       No edema  Lymphadenopathy:    She has cervical adenopathy.  Neurological: She is alert and oriented to person, place, and time. No cranial nerve deficit.  Skin: Skin is warm and dry. No rash noted.  Psychiatric: She has a normal mood and affect. Her behavior is normal.    ED Course  Procedures (including critical care time)  Labs Reviewed  POCT I-STAT, CHEM 8 - Abnormal; Notable for the following:    Potassium 3.4 (*)     Calcium, Ion 1.07 (*)     Hemoglobin 10.9 (*)     HCT 32.0 (*)     All other components within normal limits  RAPID STREP SCREEN   Dg Chest 2 View  09/05/2012  *RADIOLOGY REPORT*  Clinical Data: Cough, fever  CHEST - 2 VIEW  Comparison: Chest x-ray of 03/08/2008  Findings: No active infiltrate or effusion is seen.  There is peribronchial thickening consistent with bronchitis.  Mediastinal contours appear normal.  The heart is within normal limits in size. No bony abnormality is seen.  IMPRESSION: No pneumonia.  Probable bronchitis.   Original Report Authenticated By: Dwyane Dee, M.D.      1. Influenza       MDM   Pt with symptoms consistent with influenza.  But states vomiting, loose stool, sore throat and cough.  Exposed to family member over thanksgiving who had the flu. Normal exam here but mild tachy and afebrile.  No signs of breathing difficulty  No signs of strep pharyngitis, otitis or abnormal abdominal findings.   CXR wnl and rapid strep wnl.  I-stat pending due to concern for hypok with muscle spasm and repeated vomiting.   8:21 PM CXR wnl.  Rapid strep normal.  i-stat wnl.  Pt feeling much better after fluids and zofran.  Passed po challenge. Will continue antipyretica and rest and fluids and return for any further  problems.         Gwyneth Sprout, MD 09/05/12 1610  Gwyneth Sprout, MD 09/05/12 2028

## 2012-09-05 NOTE — ED Notes (Signed)
From an Urgent Care - C/o cough, congestion x 1 week, n/v & abd started last night. Given phenergan 25mg  & zofran 4 mg IM prior to EMS arrival.

## 2012-09-05 NOTE — ED Notes (Addendum)
C/o cold, nasal congestion, prod cough, sore throat x 1 week. Last night started with n/v/d. Last episode emesis at MD's office, had diarrhea x2 today, last time at 1100.  Reports nausea relieved with meds given at MD's office

## 2013-03-17 ENCOUNTER — Other Ambulatory Visit: Payer: Self-pay | Admitting: Obstetrics and Gynecology

## 2013-03-17 DIAGNOSIS — N6002 Solitary cyst of left breast: Secondary | ICD-10-CM

## 2013-03-27 ENCOUNTER — Ambulatory Visit
Admission: RE | Admit: 2013-03-27 | Discharge: 2013-03-27 | Disposition: A | Discharge status: Home / Self Care | Payer: 59 | Source: Ambulatory Visit | Attending: Obstetrics and Gynecology | Admitting: Obstetrics and Gynecology

## 2013-03-27 DIAGNOSIS — N6002 Solitary cyst of left breast: Secondary | ICD-10-CM

## 2015-02-18 ENCOUNTER — Emergency Department
Admission: EM | Admit: 2015-02-18 | Discharge: 2015-02-18 | Disposition: A | Discharge status: Home / Self Care | Payer: 59 | Source: Home / Self Care | Attending: Emergency Medicine | Admitting: Emergency Medicine

## 2015-02-18 ENCOUNTER — Encounter: Payer: Self-pay | Admitting: Emergency Medicine

## 2015-02-18 DIAGNOSIS — J029 Acute pharyngitis, unspecified: Secondary | ICD-10-CM

## 2015-02-18 DIAGNOSIS — H65193 Other acute nonsuppurative otitis media, bilateral: Secondary | ICD-10-CM

## 2015-02-18 DIAGNOSIS — Z8719 Personal history of other diseases of the digestive system: Secondary | ICD-10-CM | POA: Diagnosis not present

## 2015-02-18 LAB — POCT RAPID STREP A (OFFICE): Rapid Strep A Screen: NEGATIVE

## 2015-02-18 MED ORDER — ACYCLOVIR 400 MG PO TABS: 400.0000 mg | ORAL_TABLET | Freq: Four times a day (QID) | ORAL | Status: DC

## 2015-02-18 MED ORDER — HYDROCODONE-ACETAMINOPHEN 5-325 MG PO TABS: 2.0000 | ORAL_TABLET | ORAL | Status: DC | PRN

## 2015-02-18 MED ORDER — AZITHROMYCIN 250 MG PO TABS: 250.0000 mg | ORAL_TABLET | Freq: Every day | ORAL | Status: DC

## 2015-02-18 MED ORDER — ACYCLOVIR 800 MG PO TABS: 800.0000 mg | ORAL_TABLET | Freq: Four times a day (QID) | ORAL | Status: DC

## 2015-02-18 NOTE — Discharge Instructions (Signed)
Pharyngitis °Pharyngitis is redness, pain, and swelling (inflammation) of your pharynx.  °CAUSES  °Pharyngitis is usually caused by infection. Most of the time, these infections are from viruses (viral) and are part of a cold. However, sometimes pharyngitis is caused by bacteria (bacterial). Pharyngitis can also be caused by allergies. Viral pharyngitis may be spread from person to person by coughing, sneezing, and personal items or utensils (cups, forks, spoons, toothbrushes). Bacterial pharyngitis may be spread from person to person by more intimate contact, such as kissing.  °SIGNS AND SYMPTOMS  °Symptoms of pharyngitis include:   °· Sore throat.   °· Tiredness (fatigue).   °· Low-grade fever.   °· Headache. °· Joint pain and muscle aches. °· Skin rashes. °· Swollen lymph nodes. °· Plaque-like film on throat or tonsils (often seen with bacterial pharyngitis). °DIAGNOSIS  °Your health care provider will ask you questions about your illness and your symptoms. Your medical history, along with a physical exam, is often all that is needed to diagnose pharyngitis. Sometimes, a rapid strep test is done. Other lab tests may also be done, depending on the suspected cause.  °TREATMENT  °Viral pharyngitis will usually get better in 3-4 days without the use of medicine. Bacterial pharyngitis is treated with medicines that kill germs (antibiotics).  °HOME CARE INSTRUCTIONS  °· Drink enough water and fluids to keep your urine clear or pale yellow.   °· Only take over-the-counter or prescription medicines as directed by your health care provider:   °¨ If you are prescribed antibiotics, make sure you finish them even if you start to feel better.   °¨ Do not take aspirin.   °· Get lots of rest.   °· Gargle with 8 oz of salt water (½ tsp of salt per 1 qt of water) as often as every 1-2 hours to soothe your throat.   °· Throat lozenges (if you are not at risk for choking) or sprays may be used to soothe your throat. °SEEK MEDICAL  CARE IF:  °· You have large, tender lumps in your neck. °· You have a rash. °· You cough up green, yellow-brown, or bloody spit. °SEEK IMMEDIATE MEDICAL CARE IF:  °· Your neck becomes stiff. °· You drool or are unable to swallow liquids. °· You vomit or are unable to keep medicines or liquids down. °· You have severe pain that does not go away with the use of recommended medicines. °· You have trouble breathing (not caused by a stuffy nose). °MAKE SURE YOU:  °· Understand these instructions. °· Will watch your condition. °· Will get help right away if you are not doing well or get worse. °Document Released: 09/18/2005 Document Revised: 07/09/2013 Document Reviewed: 05/26/2013 °[employer]® Patient Information ©2015 [employer]. This information is not intended to replace advice given to you by your health care provider. Make sure you discuss any questions you have with your health care provider. °Otitis Media °Otitis media is redness, soreness, and inflammation of the middle ear. Otitis media may be caused by allergies or, most commonly, by infection. Often it occurs as a complication of the common cold. °SIGNS AND SYMPTOMS °Symptoms of otitis media may include: °· Earache. °· Fever. °· Ringing in your ear. °· Headache. °· Leakage of fluid from the ear. °DIAGNOSIS °To diagnose otitis media, your health care provider will examine your ear with an otoscope. This is an instrument that allows your health care provider to see into your ear in order to examine your eardrum. Your health care provider also will ask you questions about your   symptoms. °TREATMENT  °Typically, otitis media resolves on its own within 3-5 days. Your health care provider may prescribe medicine to ease your symptoms of pain. If otitis media does not resolve within 5 days or is recurrent, your health care provider may prescribe antibiotic medicines if he or she suspects that a bacterial infection is the cause. °HOME CARE INSTRUCTIONS  °· If you were  prescribed an antibiotic medicine, finish it all even if you start to feel better. °· Take medicines only as directed by your health care provider. °· Keep all follow-up visits as directed by your health care provider. °SEEK MEDICAL CARE IF: °· You have otitis media only in one ear, or bleeding from your nose, or both. °· You notice a lump on your neck. °· You are not getting better in 3-5 days. °· You feel worse instead of better. °SEEK IMMEDIATE MEDICAL CARE IF:  °· You have pain that is not controlled with medicine. °· You have swelling, redness, or pain around your ear or stiffness in your neck. °· You notice that part of your face is paralyzed. °· You notice that the bone behind your ear (mastoid) is tender when you touch it. °MAKE SURE YOU:  °· Understand these instructions. °· Will watch your condition. °· Will get help right away if you are not doing well or get worse. °Document Released: 06/23/2004 Document Revised: 02/02/2014 Document Reviewed: 04/15/2013 °Exit[employer]® Patient Information ©2015 [employer]. This information is not intended to replace advice given to you by your health care provider. Make sure you discuss any questions you have with your health care provider. ° °

## 2015-02-18 NOTE — ED Notes (Signed)
Sore throat, nausea, left ear pain, rt ear drainage, crusty, lip blisters, fever, congestion, cough x 4 days

## 2015-02-18 NOTE — ED Provider Notes (Signed)
CSN: [id number]     Arrival date & time 02/18/15  1248 History   None    Chief Complaint  Patient presents with  . Sore Throat   (Consider location/radiation/quality/duration/timing/severity/associated sxs/prior Treatment) Patient is a 34 y.o. female presenting with pharyngitis. No language interpreter was used.  Sore Throat This is a new problem. The current episode started today. The problem occurs constantly. The problem has been gradually worsening. Associated symptoms include congestion, coughing, a fever and a sore throat. Nothing aggravates the symptoms. She has tried nothing for the symptoms. The treatment provided moderate relief.  Pt has a sore throat, ear pain, and blisters on lips,   Pt reports ear feels like it is going to rupture  Past Medical History  Diagnosis Date  . Depression   . History of heartburn    Past Surgical History  Procedure Laterality Date  . Cesarean section    . Laparoscopy  04/25/2012    Procedure: LAPAROSCOPY DIAGNOSTIC;  Surgeon: Meriel Picaichard M Holland, MD;  Location: [facility];  Service: Gynecology;  Laterality: N/A;   No family history on file. History  Substance Use Topics  . Smoking status: Never Smoker   . Smokeless tobacco: Not on file  . Alcohol Use: 0.6 oz/week    1 Glasses of wine per week     Comment: occasionally   OB History    No data available     Review of Systems  Constitutional: Positive for fever.  HENT: Positive for congestion and sore throat.   Respiratory: Positive for cough.   All other systems reviewed and are negative.   Allergies  Amoxicillin and Penicillins  Home Medications   Prior to Admission medications   Medication Sig Start Date End Date Taking? Authorizing Provider  buPROPion (WELLBUTRIN XL) 150 MG 24 hr tablet Take 450 mg by mouth daily. Patient is to take 3 tablets every morning.    Historical Provider, MD  FLUoxetine (PROZAC) 20 MG capsule Take 20 mg by mouth daily.    Historical Provider, MD   ondansetron (ZOFRAN) 4 MG tablet Take 1 tablet (4 mg total) by mouth every 6 (six) hours. 09/05/12   Gwyneth SproutWhitney Plunkett, MD  Ondansetron HCl (ZOFRAN) 2 MG/ML SOLN injection Inject 4 mg into the vein once.    Historical Provider, MD  promethazine (PHENERGAN) 25 MG/ML injection Inject 25 mg into the vein once.    Historical Provider, MD  Pseudoeph-Doxylamine-DM-APAP (NYQUIL PO) Take 2 capsules by mouth daily as needed. For cough and congestion.    Historical Provider, MD   BP 110/77 mmHg  Pulse 90  Temp(Src) 98.2 F (36.8 C) (Oral)  Ht 5\' 2"  (1.575 m)  Wt 171 lb (77.565 kg)  BMI 31.27 kg/m2  SpO2 98%  LMP 02/17/2015 Physical Exam  Constitutional: She is oriented to person, place, and time. She appears well-developed and well-nourished.  HENT:  Head: Normocephalic and atraumatic.  Left Ear: External ear normal.  Nose: Nose normal.  Mouth/Throat: Oropharynx is clear and moist.  Bulging tm,  Pustules, pimples lip  Eyes: Conjunctivae and EOM are normal. Pupils are equal, round, and reactive to light.  Neck: Normal range of motion.  Pulmonary/Chest: Effort normal.  Abdominal: She exhibits no distension.  Musculoskeletal: Normal range of motion.  Neurological: She is alert and oriented to person, place, and time.  Psychiatric: She has a normal mood and affect.  Nursing note and vitals reviewed.   ED Course  Procedures (including critical care time) Labs Review Labs Reviewed  POCT RAPID STREP A (OFFICE)  strep negative  Imaging Review No results found.   MDM  Lip looks like herpes,  Tm is bulging and is concerning pt may have rupture.   I counseled pt on findings and treatment   1. Pharyngitis   acyclovir zithromax Hydrocodone AVS Return if any problerms.    Lonia SkinnerLeslie K TheresaSofia, PA-C 02/19/15 1616

## 2015-02-21 ENCOUNTER — Telehealth: Payer: Self-pay | Admitting: Emergency Medicine

## 2017-03-03 ENCOUNTER — Emergency Department
Admission: EM | Admit: 2017-03-03 | Discharge: 2017-03-03 | Disposition: A | Discharge status: Home / Self Care | Payer: 59 | Source: Home / Self Care | Attending: Family Medicine | Admitting: Family Medicine

## 2017-03-03 ENCOUNTER — Ambulatory Visit (HOSPITAL_BASED_OUTPATIENT_CLINIC_OR_DEPARTMENT_OTHER)
Admit: 2017-03-03 | Discharge: 2017-03-03 | Disposition: A | Discharge status: Home / Self Care | Payer: 59 | Attending: Family Medicine | Admitting: Family Medicine

## 2017-03-03 ENCOUNTER — Encounter: Payer: Self-pay | Admitting: Emergency Medicine

## 2017-03-03 DIAGNOSIS — M79604 Pain in right leg: Secondary | ICD-10-CM | POA: Diagnosis not present

## 2017-03-03 DIAGNOSIS — M79661 Pain in right lower leg: Secondary | ICD-10-CM | POA: Diagnosis not present

## 2017-03-03 MED ORDER — TRAMADOL HCL 50 MG PO TABS: 50.0000 mg | ORAL_TABLET | Freq: Four times a day (QID) | ORAL | 0 refills | Status: DC | PRN

## 2017-03-03 NOTE — ED Triage Notes (Signed)
[facility]/O pain in the right lower leg radiates into the ankle. Pain developed after exercising on Monday pm. Rates pain 6/10 and constant.

## 2017-03-03 NOTE — ED Provider Notes (Signed)
CSN: [id number]     Arrival date & time 03/03/17  1251 History   First MD Initiated Contact with Patient 03/03/17 1324     Chief Complaint  Patient presents with  . Leg Pain   (Consider location/radiation/quality/duration/timing/severity/associated sxs/prior Treatment) HPI  Sherry Shaw is a 36 y.o. female presenting to UC with c/o gradually worsening Right lower leg pain that radiates to her ankle.  She notes pain started 1 week ago after starting a new exercise routine.  Pain is aching and sharp, cramping, 6/10. Constant. Worse with ambulation and certain movements such as while driving her car. Denies chest pain or SOB. No hx of blood clots. She has tried rest, acetaminophen, ibuprofen but no relief.    Past Medical History:  Diagnosis Date  . Depression   . History of heartburn    Past Surgical History:  Procedure Laterality Date  . CESAREAN SECTION    . LAPAROSCOPY  04/25/2012   Procedure: LAPAROSCOPY DIAGNOSTIC;  Surgeon: Meriel Pica, MD;  Location: [facility];  Service: Gynecology;  Laterality: N/A;   History reviewed. No pertinent family history. Social History  Substance Use Topics  . Smoking status: Never Smoker  . Smokeless tobacco: Never Used  . Alcohol use 0.6 oz/week    1 Glasses of wine per week     Comment: occasionally   OB History    No data available     Review of Systems  Musculoskeletal: Positive for myalgias. Negative for arthralgias, gait problem and joint swelling.  Skin: Negative for color change, rash and wound.  Neurological: Negative for weakness and numbness.    Allergies  Amoxicillin and Penicillins  Home Medications   Prior to Admission medications   Medication Sig Start Date End Date Taking? Authorizing Provider  ibuprofen (ADVIL,MOTRIN) 400 MG tablet Take 400 mg by mouth every 6 (six) hours as needed.   Yes [provider]  acyclovir (ZOVIRAX) 800 MG tablet Take 1 tablet (800 mg total) by mouth 4 (four) times daily. 02/18/15    Elson Areas, PA-C  azithromycin (ZITHROMAX) 250 MG tablet Take 1 tablet (250 mg total) by mouth daily. Take first 2 tablets together, then 1 every day until finished. 02/18/15   Elson Areas, PA-C  buPROPion (WELLBUTRIN XL) 150 MG 24 hr tablet Take 450 mg by mouth daily. Patient is to take 3 tablets every morning.    [provider]  FLUoxetine (PROZAC) 20 MG capsule Take 20 mg by mouth daily.    [provider]  HYDROcodone-acetaminophen (NORCO/VICODIN) 5-325 MG per tablet Take 2 tablets by mouth every 4 (four) hours as needed. 02/18/15   Elson Areas, PA-C  ondansetron (ZOFRAN) 4 MG tablet Take 1 tablet (4 mg total) by mouth every 6 (six) hours. 09/05/12   Gwyneth Sprout, MD  Ondansetron HCl (ZOFRAN) 2 MG/ML SOLN injection Inject 4 mg into the vein once.    [provider]  promethazine (PHENERGAN) 25 MG/ML injection Inject 25 mg into the vein once.    [provider]  Pseudoeph-Doxylamine-DM-APAP (NYQUIL PO) Take 2 capsules by mouth daily as needed. For cough and congestion.    [provider]  traMADol (ULTRAM) 50 MG tablet Take 1 tablet (50 mg total) by mouth every 6 (six) hours as needed. 03/03/17   Junius Finner, PA-C   Meds Ordered and Administered this Visit  Medications - No data to display  BP (!) 136/101 (BP Location: Left Arm)   Pulse (!) 101  Temp 98.4 F (36.9 C) (Oral)   Resp 16   Ht 5\' 2"  (1.575 m)   Wt 184 lb 8 oz (83.7 kg)   SpO2 97%   BMI 33.75 kg/m  No data found.   Physical Exam  Constitutional: She is oriented to person, place, and time. She appears well-developed and well-nourished.  HENT:  Head: Normocephalic and atraumatic.  Eyes: EOM are normal.  Neck: Normal range of motion.  Cardiovascular: Normal rate.   Pulses:      Dorsalis pedis pulses are 2+ on the right side.       Posterior tibial pulses are 2+ on the right side.  Pulmonary/Chest: Effort normal.  Musculoskeletal: Normal range of motion.  She exhibits tenderness. She exhibits no edema.  Right lower leg: no obvious edema. Tenderness to posterior and lateral aspect. Calf is soft. Full ROM of knee and ankle.   Neurological: She is alert and oriented to person, place, and time.  Skin: Skin is warm and dry. No rash noted. No erythema.  Psychiatric: She has a normal mood and affect. Her behavior is normal.  Nursing note and vitals reviewed.   Urgent Care Course     Procedures (including critical care time)  Labs Review Labs Reviewed - No data to display  Imaging Review [place] Venous Img Lower Unilateral Right  Result Date: 03/03/2017 CLINICAL DATA:  Right posterolateral calf pain radiating to the ankle for the past 5 days. Evaluate for DVT. EXAM: RIGHT LOWER EXTREMITY VENOUS DOPPLER ULTRASOUND TECHNIQUE: Gray-scale sonography with graded compression, as well as color Doppler and duplex ultrasound were performed to evaluate the lower extremity deep venous systems from the level of the common femoral vein and including the common femoral, femoral, profunda femoral, popliteal and calf veins including the posterior tibial, peroneal and gastrocnemius veins when visible. The superficial great saphenous vein was also interrogated. Spectral Doppler was utilized to evaluate flow at rest and with distal augmentation maneuvers in the common femoral, femoral and popliteal veins. COMPARISON:  None. FINDINGS: Contralateral Common Femoral Vein: Respiratory phasicity is normal and symmetric with the symptomatic side. No evidence of thrombus. Normal compressibility. Common Femoral Vein: No evidence of thrombus. Normal compressibility, respiratory phasicity and response to augmentation. Saphenofemoral Junction: No evidence of thrombus. Normal compressibility and flow on color Doppler imaging. Profunda Femoral Vein: No evidence of thrombus. Normal compressibility and flow on color Doppler imaging. Femoral Vein: No evidence of thrombus. Normal compressibility,  respiratory phasicity and response to augmentation. Popliteal Vein: No evidence of thrombus. Normal compressibility, respiratory phasicity and response to augmentation. Calf Veins: No evidence of thrombus. Normal compressibility and flow on color Doppler imaging. Superficial Great Saphenous Vein: No evidence of thrombus. Normal compressibility and flow on color Doppler imaging. Venous Reflux:  None. Other Findings:  None. IMPRESSION: No evidence of DVT within the right lower extremity. Electronically Signed   By: Simonne ComeJohn  Watts M.D.   On: 03/03/2017 14:55     MDM   1. Right leg pain   2. Right calf pain    Concern for DVT due to reports of severe leg pain.   [facility] of Right lower leg.   U/S: negative for DVT  Pain likely due to muscle strain and shin splints.  Encouraged alternating cool and warm compresses, gentle massage and stretches. Rx: tramadol and ibuprofen  F/u with PCP or Sports Medicine in 1 week if not improving.    Junius FinnerO'Malley, Jacqulyne Gladue, PA-C 03/03/17 [phone]

## 2018-03-24 ENCOUNTER — Other Ambulatory Visit: Payer: Self-pay

## 2018-03-24 ENCOUNTER — Emergency Department (INDEPENDENT_AMBULATORY_CARE_PROVIDER_SITE_OTHER): Payer: BLUE CROSS/BLUE SHIELD

## 2018-03-24 ENCOUNTER — Emergency Department (INDEPENDENT_AMBULATORY_CARE_PROVIDER_SITE_OTHER)
Admission: EM | Admit: 2018-03-24 | Discharge: 2018-03-24 | Disposition: A | Discharge status: Home / Self Care | Payer: BLUE CROSS/BLUE SHIELD | Source: Home / Self Care | Attending: Family Medicine | Admitting: Family Medicine

## 2018-03-24 DIAGNOSIS — R000 Tachycardia, unspecified: Secondary | ICD-10-CM

## 2018-03-24 DIAGNOSIS — R079 Chest pain, unspecified: Secondary | ICD-10-CM | POA: Diagnosis not present

## 2018-03-24 DIAGNOSIS — M940 Chondrocostal junction syndrome [Tietze]: Secondary | ICD-10-CM

## 2018-03-24 LAB — POCT CBC W AUTO DIFF (K'VILLE URGENT CARE)

## 2018-03-24 NOTE — ED Triage Notes (Signed)
Pt c/o intermittent dizziness and CP x 1 week. Left sided arm numbness. Also mentioned feeling palpitations on and off all week.

## 2018-03-24 NOTE — ED Provider Notes (Addendum)
[facility]    CSN: [id number] Arrival date & time: 03/24/18  1556     History   Chief Complaint Chief Complaint  Patient presents with  . Dizziness  . Chest Pain    HPI Sherry Shaw is a 37 y.o. female.   Patient complains of intermittent pain in her left chest radiating to her left shoulder and arm for one week.  She has also had intermittent numbness in her left arm and palpitations during the past week.  She has had fatigue, dizziness, and occasional sweats.   She had an influenza-like illness two months ago that resolved, and another viral illness last month.  She had generally improved except for non-productive cough.  No shortness of breath or wheezing.  The history is provided by the patient.    Past Medical History:  Diagnosis Date  . Depression   . History of heartburn     Patient Active Problem List   Diagnosis Date Noted  . DYSPAREUNIA 02/15/2009  . ALOPECIA 01/27/2009  . VERTIGO 01/27/2009  . FATIGUE 01/27/2009    Past Surgical History:  Procedure Laterality Date  . CESAREAN SECTION    . LAPAROSCOPY  04/25/2012   Procedure: LAPAROSCOPY DIAGNOSTIC;  Surgeon: Meriel Pica, MD;  Location: [facility];  Service: Gynecology;  Laterality: N/A;    OB History   None      Home Medications    Prior to Admission medications   Medication Sig Start Date End Date Taking? Authorizing Provider  buPROPion (WELLBUTRIN XL) 150 MG 24 hr tablet Take 450 mg by mouth daily. Patient is to take 3 tablets every morning.    [provider]  FLUoxetine (PROZAC) 20 MG capsule Take 20 mg by mouth daily.    [provider]  ibuprofen (ADVIL,MOTRIN) 400 MG tablet Take 400 mg by mouth every 6 (six) hours as needed.    [provider]  ondansetron (ZOFRAN) 4 MG tablet Take 1 tablet (4 mg total) by mouth every 6 (six) hours. 09/05/12   Gwyneth Sprout, MD  Pseudoeph-Doxylamine-DM-APAP (NYQUIL PO) Take 2 capsules by mouth daily as needed.  For cough and congestion.    [provider]    Family History Family History  Family history unknown: Yes    Social History Social History   Tobacco Use  . Smoking status: Never Smoker  . Smokeless tobacco: Never Used  Substance Use Topics  . Alcohol use: Yes    Alcohol/week: 0.6 oz    Types: 1 Glasses of wine per week    Comment: occasionally  . Drug use: No     Allergies   Amoxicillin and Penicillins   Review of Systems Review of Systems No sore throat + cough + pleuritic pain No wheezing No nasal congestion No post-nasal drainage No sinus pain/pressure No itchy/red eyes No earache No hemoptysis No SOB No fever, + chills/sweats No nausea No vomiting No abdominal pain No diarrhea No urinary symptoms No skin rash + fatigue No myalgias No headache    Physical Exam Triage Vital Signs ED Triage Vitals [03/24/18 1619]  Enc Vitals Group     BP (!) 168/119     Pulse Rate (!) 118     Resp 20     Temp 97.7 F (36.5 C)     Temp Source Oral     SpO2 97 %     Weight 195 lb (88.5 kg)     Height 5\' 2"  (1.575 m)  Head Circumference      Peak Flow      Pain Score 0     Pain Loc      Pain Edu?      Excl. in GC?    Orthostatic VS for the past 24 hrs:  BP- Lying Pulse- Lying BP- Sitting Pulse- Sitting BP- Standing at 0 minutes Pulse- Standing at 0 minutes  03/24/18 1627 128/90 104 (!) 136/101 116 (!) 139/96 117    Updated Vital Signs BP (!) 168/119 (BP Location: Right Arm)   Pulse (!) 118   Temp 97.7 F (36.5 C) (Oral)   Resp 20   Ht 5\' 2"  (1.575 m)   Wt 195 lb (88.5 kg)   LMP 03/11/2018   SpO2 97%   BMI 35.67 kg/m   Visual Acuity Right Eye Distance:   Left Eye Distance:   Bilateral Distance:    Right Eye Near:   Left Eye Near:    Bilateral Near:     Physical Exam  Constitutional: She appears well-developed and well-nourished. No distress.  HENT:  Head: Normocephalic.  Right Ear: Tympanic membrane, external ear and ear  canal normal.  Left Ear: Tympanic membrane, external ear and ear canal normal.  Nose: Nose normal.  Mouth/Throat: Oropharynx is clear and moist.  Eyes: Pupils are equal, round, and reactive to light. Conjunctivae and EOM are normal.  Neck: Neck supple.  Cardiovascular: Regular rhythm and normal heart sounds. Exam reveals no gallop and no friction rub.  No murmur heard. Rate 114  Pulmonary/Chest: Effort normal and breath sounds normal. No respiratory distress. She has no wheezes. She has no rales. She exhibits tenderness.  Chest:  Distinct tenderness to palpation over the upper mid-sternum extending laterally as noted on diagram.     Abdominal: There is no tenderness.  Musculoskeletal: She exhibits no edema or tenderness.  Neurological: She is alert.  Skin: Skin is warm and dry. No rash noted.  Nursing note and vitals reviewed.    UC Treatments / Results  Labs (all labs ordered are listed, but only abnormal results are displayed) Labs Reviewed  D-DIMER, QUANTITATIVE ([facility])  SEDIMENTATION RATE  POCT CBC W AUTO DIFF ([facility]):  WBC 9.1; LY 20.9; MO 8.0; GR 71.1; Hgb 13.8; Platelets 361     EKG  Rate:  114 BPM PR:  152 msec QT:  350 msec QTcH:  482 msec QRSD:  76 msec QRS axis:  47 degrees Interpretation:  Sinus tachycardia; otherwise within normal limits   Radiology Dg Ribs Unilateral W/chest Left  Result Date: 03/24/2018 CLINICAL DATA:  Left upper anterior chest pain for the past week with some shortness of breath. Cough for the past 2 months. EXAM: LEFT RIBS AND CHEST - 3+ VIEW COMPARISON:  Chest dated 09/05/2012. FINDINGS: Normal sized heart. Clear lungs. Normal appearing left ribs. No fracture or pneumothorax seen. IMPRESSION: No acute abnormality. Electronically Signed   By: Beckie SaltsSteven  Reid M.D.   On: 03/24/2018 17:44    Procedures Procedures (including critical care time)  Medications Ordered in UC Medications - No data to display  Initial  Impression / Assessment and Plan / UC Course  I have reviewed the triage vital signs and the nursing notes.  Pertinent labs & imaging results that were available during my care of the patient were reviewed by me and considered in my medical decision making (see chart for details).    Normal CBC reassuring. EKG unremarkable except for sinus tachycardia; cause of tachycardia  not clear. Although patient does not appear to be at risk for DVT or PE, will check D-dimer; if positive will need CT angiogram. Symptoms are somewhat suggestive of pericarditis.  EKG has no typical changes and no friction rub heard. Sed rate pending. Followup with Family Doctor for further evaluation as soon as possible.  Final Clinical Impressions(s) / UC Diagnoses   Final diagnoses:  Costochondritis  Sinus tachycardia     Discharge Instructions     Apply ice pack for 20 to 30 minutes, 3 to 4 times daily for 2 to 3 days.  Take Ibuprofen 200mg , 4 tabs every 8 hours with food.     ED Prescriptions    None        Lattie Haw, MD 03/24/18 2224    Lattie Haw, MD 03/25/18 [phone]

## 2018-03-24 NOTE — Discharge Instructions (Addendum)
Apply ice pack for 20 to 30 minutes, 3 to 4 times daily for 2 to 3 days.  Take Ibuprofen 200mg , 4 tabs every 8 hours with food.

## 2018-03-25 ENCOUNTER — Telehealth: Payer: Self-pay

## 2018-03-25 LAB — UNLABELED: Test Ordered On Req: 809

## 2018-03-25 NOTE — Telephone Encounter (Signed)
Left a message on VM to call UC to follow up.

## 2018-03-25 NOTE — Telephone Encounter (Signed)
spoke with Merry ProudBrandi at [facility], and faxed the forms to her for labs

## 2018-03-26 LAB — PAT ID TIQ DOC: Test Affected: 809

## 2018-03-26 LAB — D-DIMER, QUANTITATIVE (NOT AT ARMC): D DIMER QUANT: 0.27 ug{FEU}/mL (ref <0.50)

## 2018-03-26 LAB — SEDIMENTATION RATE: Sed Rate: 33 mm/h — ABNORMAL HIGH (ref 0–20)

## 2018-03-26 LAB — SPECIMEN ID NOTIFICATION MISSING 2ND ID

## 2018-03-26 NOTE — Telephone Encounter (Signed)
Notified patient of D-Dimer results.

## 2018-03-26 NOTE — Telephone Encounter (Signed)
Left VM with sed rate results.

## 2018-03-27 ENCOUNTER — Telehealth: Payer: Self-pay | Admitting: *Deleted

## 2018-03-27 NOTE — Telephone Encounter (Signed)
LM to call back. Dr Cathren HarshBeese would like to know if she has had increased heart rate. If she has she should f/u  With her PCP to r/o pericarditis since her sed rate was elevated. If heart rate is normal then continue to monitor and f/u with her PCP as needed.

## 2018-06-25 ENCOUNTER — Other Ambulatory Visit: Payer: Self-pay

## 2018-06-25 ENCOUNTER — Emergency Department (INDEPENDENT_AMBULATORY_CARE_PROVIDER_SITE_OTHER)
Admission: EM | Admit: 2018-06-25 | Discharge: 2018-06-25 | Disposition: A | Discharge status: Home / Self Care | Payer: PRIVATE HEALTH INSURANCE | Source: Home / Self Care | Attending: Family Medicine | Admitting: Family Medicine

## 2018-06-25 DIAGNOSIS — J069 Acute upper respiratory infection, unspecified: Secondary | ICD-10-CM

## 2018-06-25 DIAGNOSIS — R112 Nausea with vomiting, unspecified: Secondary | ICD-10-CM

## 2018-06-25 DIAGNOSIS — B9789 Other viral agents as the cause of diseases classified elsewhere: Secondary | ICD-10-CM | POA: Diagnosis not present

## 2018-06-25 DIAGNOSIS — J029 Acute pharyngitis, unspecified: Secondary | ICD-10-CM

## 2018-06-25 LAB — POCT RAPID STREP A (OFFICE): Rapid Strep A Screen: NEGATIVE

## 2018-06-25 MED ORDER — ONDANSETRON 4 MG PO TBDP: ORAL_TABLET | ORAL | 0 refills | Status: AC

## 2018-06-25 MED ORDER — ONDANSETRON 4 MG PO TBDP: ORAL_TABLET | ORAL | 0 refills | Status: DC

## 2018-06-25 MED ORDER — AZITHROMYCIN 250 MG PO TABS: ORAL_TABLET | ORAL | 0 refills | Status: DC

## 2018-06-25 NOTE — Discharge Instructions (Addendum)
Begin clear liquids for about 12 hours, then may begin a BRAT diet (Bananas, Rice, Applesauce, Toast) when nausea and vomiting resolved.  Then gradually advance to a regular diet as tolerated.  Avoid milk products until well.   For cold symptoms try the following: Take plain guaifenesin (1200mg  extended release tabs such as Mucinex) twice daily, with plenty of water, for cough and congestion.  May add Pseudoephedrine (30mg , one or two every 4 to 6 hours) for sinus congestion.  Get adequate rest.   May use Afrin nasal spray (or generic oxymetazoline) each morning for about 5 days and then discontinue.  Also recommend using saline nasal spray several times daily and saline nasal irrigation (AYR is a common brand).   Try warm salt water gargles for sore throat.  May take Delsym Cough Suppressant at bedtime for nighttime cough.  Stop all antihistamines for now, and other non-prescription cough/cold preparations. May take Ibuprofen 200mg , 4 tabs every 8 hours with food for sore throat, fever, body aches, etc. Begin Azithromycin if not improving about one week or if persistent fever develops

## 2018-06-25 NOTE — ED Provider Notes (Signed)
[facility]    CSN: [id number] Arrival date & time: 06/25/18  1836     History   Chief Complaint Chief Complaint  Patient presents with  . Sore Throat  . Emesis  . Fever    HPI Sherry Shaw is a 37 y.o. female.   Patient developed a cough 2 days ago.  She awoke fatigued yesterday with sore throat, nausea and an episode of vomiting.  She now has sinus congestion, headache, and myalgias.  She has had fever to 101 today.  The history is provided by the patient.    Past Medical History:  Diagnosis Date  . Depression   . History of heartburn     Patient Active Problem List   Diagnosis Date Noted  . DYSPAREUNIA 02/15/2009  . ALOPECIA 01/27/2009  . VERTIGO 01/27/2009  . FATIGUE 01/27/2009    Past Surgical History:  Procedure Laterality Date  . CESAREAN SECTION    . LAPAROSCOPY  04/25/2012   Procedure: LAPAROSCOPY DIAGNOSTIC;  Surgeon: Meriel Picaichard M Holland, MD;  Location: [facility];  Service: Gynecology;  Laterality: N/A;    OB History   None      Home Medications    Prior to Admission medications   Medication Sig Start Date End Date Taking? Authorizing Provider  azithromycin (ZITHROMAX Z-PAK) 250 MG tablet Take 2 tabs today; then begin one tab once daily for 4 more days. (Rx void after 07/04/18) 06/25/18   Lattie HawBeese, Deleah Tison A, MD  buPROPion (WELLBUTRIN XL) 150 MG 24 hr tablet Take 450 mg by mouth daily. Patient is to take 3 tablets every morning.    [provider]  FLUoxetine (PROZAC) 20 MG capsule Take 20 mg by mouth daily.    [provider]  ibuprofen (ADVIL,MOTRIN) 400 MG tablet Take 400 mg by mouth every 6 (six) hours as needed.    [provider]  ondansetron (ZOFRAN ODT) 4 MG disintegrating tablet Take one tab by mouth Q6hr prn nausea.  Dissolve under tongue. 06/25/18   Lattie HawBeese, Case Vassell A, MD  ondansetron (ZOFRAN) 4 MG tablet Take 1 tablet (4 mg total) by mouth every 6 (six) hours. 09/05/12   Gwyneth SproutPlunkett, Whitney, MD    Pseudoeph-Doxylamine-DM-APAP (NYQUIL PO) Take 2 capsules by mouth daily as needed. For cough and congestion.    [provider]    Family History Family History  Family history unknown: Yes    Social History Social History   Tobacco Use  . Smoking status: Never Smoker  . Smokeless tobacco: Never Used  Substance Use Topics  . Alcohol use: Yes    Alcohol/week: 1.0 standard drinks    Types: 1 Glasses of wine per week    Comment: occasionally  . Drug use: No     Allergies   Amoxicillin and Penicillins   Review of Systems Review of Systems + sore throat + cough No pleuritic pain No wheezing + nasal congestion + post-nasal drainage + sinus pain/pressure No itchy/red eyes No earache No hemoptysis No SOB + fever, + chills + nausea No vomiting No abdominal pain No diarrhea No urinary symptoms No skin rash + fatigue + myalgias + headache Used OTC meds without relief   Physical Exam Triage Vital Signs ED Triage Vitals  Enc Vitals Group     BP 06/25/18 1911 (!) 149/106     Pulse Rate 06/25/18 1911 (!) 113     Resp --      Temp 06/25/18 1911 98 F (36.7 C)  Temp Source 06/25/18 1911 Oral     SpO2 06/25/18 1911 97 %     Weight 06/25/18 1912 195 lb (88.5 kg)     Height 06/25/18 1912 5\' 2"  (1.575 m)     Head Circumference --      Peak Flow --      Pain Score 06/25/18 1912 8     Pain Loc --      Pain Edu? --      Excl. in GC? --    No data found.  Updated Vital Signs BP (!) 149/106 (BP Location: Right Arm)   Pulse (!) 113   Temp 98 F (36.7 C) (Oral)   Ht 5\' 2"  (1.575 m)   Wt 88.5 kg   SpO2 97%   BMI 35.67 kg/m   Visual Acuity Right Eye Distance:   Left Eye Distance:   Bilateral Distance:    Right Eye Near:   Left Eye Near:    Bilateral Near:     Physical Exam Nursing notes and Vital Signs reviewed. Appearance:  Patient appears stated age, and in no acute distress Eyes:  Pupils are equal, round, and reactive to light and  accomodation.  Extraocular movement is intact.  Conjunctivae are not inflamed  Ears:  Canals normal.  Tympanic membranes normal.  Nose:  Mildly congested turbinates.  No sinus tenderness. Pharynx:  Erythematous Neck:  Supple.  Enlarged posterior/lateral nodes are palpated bilaterally, tender to palpation on the left.  Tonsillar nodes are mildly enlarged and tender to palpation. Lungs:  Clear to auscultation.  Breath sounds are equal.  Moving air well. Heart:  Regular rate and rhythm without murmurs, rubs, or gallops.  Abdomen:  Nontender without masses or hepatosplenomegaly.  Bowel sounds are present.  No CVA or flank tenderness.  Extremities:  No edema.  Skin:  No rash present.    UC Treatments / Results  Labs (all labs ordered are listed, but only abnormal results are displayed) Labs Reviewed  STREP A DNA PROBE  POCT RAPID STREP A (OFFICE) negative    EKG None  Radiology No results found.  Procedures Procedures (including critical care time)  Medications Ordered in UC Medications - No data to display  Initial Impression / Assessment and Plan / UC Course  I have reviewed the triage vital signs and the nursing notes.  Pertinent labs & imaging results that were available during my care of the patient were reviewed by me and considered in my medical decision making (see chart for details).    Throat culture pending. Administered Zofran ODT 4mg  PO; given Rx for same. There is no evidence of bacterial infection today.   Treat symptomatically for now. Followup with Family Doctor if not improved in about 10 days.   Final Clinical Impressions(s) / UC Diagnoses   Final diagnoses:  Viral URI with cough  Non-intractable vomiting with nausea, unspecified vomiting type     Discharge Instructions     Begin clear liquids for about 12 hours, then may begin a BRAT diet (Bananas, Rice, Applesauce, Toast) when nausea and vomiting resolved.  Then gradually advance to a regular diet  as tolerated.  Avoid milk products until well.   For cold symptoms try the following: Take plain guaifenesin (1200mg  extended release tabs such as Mucinex) twice daily, with plenty of water, for cough and congestion.  May add Pseudoephedrine (30mg , one or two every 4 to 6 hours) for sinus congestion.  Get adequate rest.   May use Afrin nasal spray (  or generic oxymetazoline) each morning for about 5 days and then discontinue.  Also recommend using saline nasal spray several times daily and saline nasal irrigation (AYR is a common brand).   Try warm salt water gargles for sore throat.  May take Delsym Cough Suppressant at bedtime for nighttime cough.  Stop all antihistamines for now, and other non-prescription cough/cold preparations. May take Ibuprofen 200mg , 4 tabs every 8 hours with food for sore throat, fever, body aches, etc. Begin Azithromycin if not improving about one week or if persistent fever develops     ED Prescriptions    Medication Sig Dispense Auth. Provider   ondansetron (ZOFRAN ODT) 4 MG disintegrating tablet  (Status: Discontinued) Take one tab by mouth Q6hr prn nausea.  Dissolve under tongue. 12 tablet Lattie Haw, MD   ondansetron (ZOFRAN ODT) 4 MG disintegrating tablet Take one tab by mouth Q6hr prn nausea.  Dissolve under tongue. 12 tablet Lattie Haw, MD   azithromycin (ZITHROMAX Z-PAK) 250 MG tablet Take 2 tabs today; then begin one tab once daily for 4 more days. (Rx void after 07/04/18) 6 tablet Lattie Haw, MD        Lattie Haw, MD 06/27/18 [phone]

## 2018-06-25 NOTE — ED Triage Notes (Signed)
Pt had a sore throat all day yesterday, and vomiting last night.

## 2018-06-26 LAB — STREP A DNA PROBE: GROUP A STREP PROBE: NOT DETECTED

## 2018-06-27 ENCOUNTER — Telehealth: Payer: Self-pay

## 2018-06-27 NOTE — Telephone Encounter (Signed)
Left neg tcx results on pts vm. Advised to call with any questions.

## 2021-01-07 ENCOUNTER — Emergency Department
Admission: EM | Admit: 2021-01-07 | Discharge: 2021-01-07 | Disposition: A | Discharge status: Home / Self Care | Payer: PRIVATE HEALTH INSURANCE | Source: Home / Self Care

## 2021-01-07 ENCOUNTER — Other Ambulatory Visit: Payer: Self-pay

## 2021-01-07 DIAGNOSIS — R220 Localized swelling, mass and lump, head: Secondary | ICD-10-CM

## 2021-01-07 DIAGNOSIS — H6121 Impacted cerumen, right ear: Secondary | ICD-10-CM

## 2021-01-07 DIAGNOSIS — R590 Localized enlarged lymph nodes: Secondary | ICD-10-CM | POA: Diagnosis not present

## 2021-01-07 DIAGNOSIS — H6991 Unspecified Eustachian tube disorder, right ear: Secondary | ICD-10-CM

## 2021-01-07 DIAGNOSIS — I10 Essential (primary) hypertension: Secondary | ICD-10-CM

## 2021-01-07 HISTORY — DX: Heart failure, unspecified: I50.9

## 2021-01-07 MED ORDER — DEXAMETHASONE 1 MG/ML PO CONC
10.0000 mg | Freq: Once | ORAL | Status: AC
  Administered 2021-01-07: 10 mg via ORAL

## 2021-01-07 MED ORDER — CARBAMIDE PEROXIDE 6.5 % OT SOLN: 5.0000 [drp] | Freq: Two times a day (BID) | OTIC | 0 refills | Status: AC | PRN

## 2021-01-07 MED ORDER — AZITHROMYCIN 250 MG PO TABS: ORAL_TABLET | ORAL | 0 refills | Status: DC

## 2021-01-07 NOTE — ED Triage Notes (Signed)
Pt c/o LT ear pain x 3 days. Does feel the pain her her jaw as well. Describes as a pressure. Tylenol prn. Pain 6/10  Pt is hypertensive today, says she just took her BP meds a couple hours ago.

## 2021-01-07 NOTE — ED Provider Notes (Signed)
[facility]    CSN: [id number] Arrival date & time: 01/07/21  1326      History   Chief Complaint Chief Complaint  Patient presents with  . Otalgia    LT    HPI Sherry Shaw is a 40 y.o. female.   HPI Left ear pain and worsening pressure x 3 days. Pain in ET tube region radiating into the inner left ear. Tragus tenderness present and left jaw pain. BP elevated today. Took BP medication prior to arrival. Has had intermittent vertigo symptoms attributed to vertigo (chronic) not inner problem or elevated BP. no changes in vision. No URI symptoms.Afebrile.   Past Medical History:  Diagnosis Date  . CHF (congestive heart failure) (HCC)   . Depression   . History of heartburn     Patient Active Problem List   Diagnosis Date Noted  . DYSPAREUNIA 02/15/2009  . ALOPECIA 01/27/2009  . VERTIGO 01/27/2009  . FATIGUE 01/27/2009    Past Surgical History:  Procedure Laterality Date  . CESAREAN SECTION    . LAPAROSCOPY  04/25/2012   Procedure: LAPAROSCOPY DIAGNOSTIC;  Surgeon: Meriel Pica, MD;  Location: [facility];  Service: Gynecology;  Laterality: N/A;    OB History   No obstetric history on file.      Home Medications    Prior to Admission medications   Medication Sig Start Date End Date Taking? Authorizing Provider  azithromycin (ZITHROMAX) 250 MG tablet Take 2 tabs PO x 1 dose, then 1 tab PO QD x 4 days 01/07/21  Yes Bing Neighbors, FNP  carbamide peroxide (DEBROX) 6.5 % OTIC solution Place 5 drops into the right ear 2 (two) times daily as needed (5 days. may repeat as needed). 01/07/21  Yes Bing Neighbors, FNP  carvedilol (COREG) 6.25 MG tablet Take by mouth. 03/29/20  Yes [provider]  furosemide (LASIX) 20 MG tablet Take 1 tablet by mouth daily. 12/10/20  Yes [provider]  omeprazole (PRILOSEC) 20 MG capsule 1 cap(s) 07/18/19  Yes [provider]  potassium chloride (KLOR-CON) 10 MEQ tablet Take by mouth. 06/29/20   Yes [provider]  buPROPion (WELLBUTRIN XL) 150 MG 24 hr tablet Take 450 mg by mouth daily. Patient is to take 3 tablets every morning.    [provider]  FLUoxetine (PROZAC) 20 MG capsule Take 20 mg by mouth daily.    [provider]  ibuprofen (ADVIL,MOTRIN) 400 MG tablet Take 400 mg by mouth every 6 (six) hours as needed.    [provider]  losartan (COZAAR) 50 MG tablet 1 tab(s)    [provider]  ondansetron (ZOFRAN ODT) 4 MG disintegrating tablet Take one tab by mouth Q6hr prn nausea.  Dissolve under tongue. 06/25/18   Lattie Haw, MD  ondansetron (ZOFRAN) 4 MG tablet Take 1 tablet (4 mg total) by mouth every 6 (six) hours. 09/05/12   Gwyneth Sprout, MD  Pseudoeph-Doxylamine-DM-APAP (NYQUIL PO) Take 2 capsules by mouth daily as needed. For cough and congestion.    [provider]    Family History Family History  Family history unknown: Yes    Social History Social History   Tobacco Use  . Smoking status: Never Smoker  . Smokeless tobacco: Never Used  Vaping Use  . Vaping Use: Never used  Substance Use Topics  . Alcohol use: Yes    Alcohol/week: 1.0 standard drink    Types: 1 Glasses of wine per week  Comment: occasionally  . Drug use: No     Allergies   Amoxicillin and Penicillins   Review of Systems Review of Systems Pertinent negatives listed in HPI   Physical Exam Triage Vital Signs ED Triage Vitals  Enc Vitals Group     BP 01/07/21 1339 (!) 165/113     Pulse Rate 01/07/21 1339 94     Resp 01/07/21 1339 18     Temp 01/07/21 1339 98.2 F (36.8 C)     Temp Source 01/07/21 1339 Oral     SpO2 01/07/21 1339 96 %     Weight --      Height --      Head Circumference --      Peak Flow --      Pain Score 01/07/21 1341 6     Pain Loc --      Pain Edu? --      Excl. in GC? --    No data found.  Updated Vital Signs BP (!) 165/113 (BP Location: Right Arm)   Pulse 94   Temp 98.2 F (36.8  C) (Oral)   Resp 18   LMP 12/26/2020 (Approximate)   SpO2 96%   Visual Acuity Right Eye Distance:   Left Eye Distance:   Bilateral Distance:    Right Eye Near:   Left Eye Near:    Bilateral Near:     Physical Exam  General Appearance:    Alert, obese, cooperative, no distress  HENT:   Normocephalic, right ear Cerumen impaction, left ear MEE, erythematous TM  Preauricular adenitis, nares mucosal edema with congestion, O/P normal     Eyes:    PERRL, conjunctiva/corneas clear, EOM's intact       Lungs:     Clear to auscultation bilaterally, respirations unlabored  Heart:    Regular rate and rhythm  Neurologic:   Awake, alert, oriented x 3. No apparent focal neurological           defect.      UC Treatments / Results  Labs (all labs ordered are listed, but only abnormal results are displayed) Labs Reviewed - No data to display  EKG   Radiology No results found.  Procedures Procedures (including critical care time)  Medications Ordered in UC Medications  dexamethasone (DECADRON) 1 MG/ML solution 10 mg (10 mg Oral Given 01/07/21 1431)    Initial Impression / Assessment and Plan / UC Course  I have reviewed the triage vital signs and the nursing notes.  Pertinent labs & imaging results that were available during my care of the patient were reviewed by me and considered in my medical decision making (see chart for details).    Oral decadron given here in clinic for left jaw tenderness, swelling, preauricular adenitis. Start Azithromycin for inner infection. Debrox for right ear cerumen impaction.  Strict PCP follow-up if symptoms worsen or not improve. Final Clinical Impressions(s) / UC Diagnoses   Final diagnoses:  Preauricular adenopathy  Left facial swelling  Impacted cerumen of right ear  Eustachian tube disorder, right  Accelerated hypertension   Discharge Instructions   None    ED Prescriptions    Medication Sig Dispense Auth. Provider   carbamide  peroxide (DEBROX) 6.5 % OTIC solution Place 5 drops into the right ear 2 (two) times daily as needed (5 days. may repeat as needed). 15 mL Bing Neighbors, FNP   azithromycin (ZITHROMAX) 250 MG tablet Take 2 tabs PO x 1 dose, then 1 tab PO  QD x 4 days 6 tablet Bing Neighbors, FNP     PDMP not reviewed this encounter.   Bing Neighbors, FNP 01/07/21 2141

## 2021-01-26 ENCOUNTER — Emergency Department (HOSPITAL_COMMUNITY): Payer: No Typology Code available for payment source

## 2021-01-26 ENCOUNTER — Encounter (HOSPITAL_COMMUNITY): Payer: Self-pay | Admitting: Emergency Medicine

## 2021-01-26 ENCOUNTER — Emergency Department (HOSPITAL_COMMUNITY)
Admission: EM | Admit: 2021-01-26 | Discharge: 2021-01-27 | Disposition: A | Discharge status: Home / Self Care | Payer: No Typology Code available for payment source | Attending: Emergency Medicine | Admitting: Emergency Medicine

## 2021-01-26 DIAGNOSIS — R0789 Other chest pain: Secondary | ICD-10-CM

## 2021-01-26 DIAGNOSIS — R12 Heartburn: Secondary | ICD-10-CM | POA: Diagnosis not present

## 2021-01-26 DIAGNOSIS — R110 Nausea: Secondary | ICD-10-CM | POA: Diagnosis not present

## 2021-01-26 DIAGNOSIS — R531 Weakness: Secondary | ICD-10-CM | POA: Insufficient documentation

## 2021-01-26 DIAGNOSIS — I509 Heart failure, unspecified: Secondary | ICD-10-CM | POA: Insufficient documentation

## 2021-01-26 DIAGNOSIS — R079 Chest pain, unspecified: Secondary | ICD-10-CM

## 2021-01-26 LAB — BASIC METABOLIC PANEL
Anion gap: 9 (ref 5–15)
BUN: 9 mg/dL (ref 6–20)
CO2: 26 mmol/L (ref 22–32)
Calcium: 9.3 mg/dL (ref 8.9–10.3)
Chloride: 98 mmol/L (ref 98–111)
Creatinine, Ser: 0.6 mg/dL (ref 0.44–1.00)
GFR, Estimated: >60 mL/min (ref >60)
Glucose, Bld: 109 mg/dL — ABNORMAL HIGH (ref 70–99)
Potassium: 3.5 mmol/L (ref 3.5–5.1)
Sodium: 133 mmol/L — ABNORMAL LOW (ref 135–145)

## 2021-01-26 LAB — CBC
HCT: 36.7 % (ref 36.0–46.0)
Hemoglobin: 11.8 g/dL — ABNORMAL LOW (ref 12.0–15.0)
MCH: 28 pg (ref 26.0–34.0)
MCHC: 32.2 g/dL (ref 30.0–36.0)
MCV: 87 fL (ref 80.0–100.0)
Platelets: 356 10*3/uL (ref 150–400)
RBC: 4.22 MIL/uL (ref 3.87–5.11)
RDW: 14.6 % (ref 11.5–15.5)
WBC: 9 10*3/uL (ref 4.0–10.5)
nRBC: 0 % (ref 0.0–0.2)

## 2021-01-26 LAB — TROPONIN I (HIGH SENSITIVITY)
Troponin I (High Sensitivity): 4 ng/L (ref <18)
Troponin I (High Sensitivity): 5 ng/L (ref <18)

## 2021-01-26 NOTE — ED Triage Notes (Signed)
Emergency Medicine Provider Triage Evaluation Note  Sherry Shaw , a 40 y.o. female  was evaluated in triage.  Pt complains of medsternal CP, radiates to left arm with nausea while at work today. EMS gave ASA, no nitro. Hx CHF, LBBB.   Review of Systems  Positive: CP, nausea Negative: Vomiting, diaphoresis  Physical Exam  There were no vitals taken for this visit. Gen:   Awake, no distress   HEENT:  Atraumatic  Resp:  Normal effort  Cardiac:  Normal rate  Abd:   Nondistended, nontender  MSK:   Moves extremities without difficulty  Neuro:  Speech clear   Medical Decision Making  Medically screening exam initiated at 5:38 PM.  Appropriate orders placed.  Sherry Shaw was informed that the remainder of the evaluation will be completed by another provider, this initial triage assessment does not replace that evaluation, and the importance of remaining in the ED until their evaluation is complete.  Clinical Impression     Sherry Shaw 01/26/21 1738

## 2021-01-26 NOTE — ED Provider Notes (Addendum)
[facility] EMERGENCY DEPARTMENT Provider Note   CSN: [id number] Arrival date & time: 01/26/21  1736     History No chief complaint on file.   Sherry Shaw is a 40 y.o. female.  Patient presents to ED today for episode of "burning" right sided/midsternal chest discomfort that radiated to left arm while at work today. She felt hot, flushed, and momentarily weak and disoriented. Patient with history of CHF and GERD. Known left bundle branch block. Nauseated earlier, now resolved. No shortness of breath or abdominal pain.   The history is provided by the patient. No language interpreter was used.  Chest Pain Pain location:  R chest and substernal area Pain quality: burning   Pain radiates to:  L arm Pain severity:  Mild Onset quality:  Sudden Progression:  Partially resolved Associated symptoms: heartburn, nausea and weakness   Associated symptoms: no abdominal pain and no shortness of breath        Past Medical History:  Diagnosis Date  . CHF (congestive heart failure) (HCC)   . Depression   . History of heartburn     Patient Active Problem List   Diagnosis Date Noted  . DYSPAREUNIA 02/15/2009  . ALOPECIA 01/27/2009  . VERTIGO 01/27/2009  . FATIGUE 01/27/2009    Past Surgical History:  Procedure Laterality Date  . CESAREAN SECTION    . LAPAROSCOPY  04/25/2012   Procedure: LAPAROSCOPY DIAGNOSTIC;  Surgeon: Meriel Pica, MD;  Location: [facility];  Service: Gynecology;  Laterality: N/A;     OB History   No obstetric history on file.     Family History  Family history unknown: Yes    Social History   Tobacco Use  . Smoking status: Never Smoker  . Smokeless tobacco: Never Used  Vaping Use  . Vaping Use: Never used  Substance Use Topics  . Alcohol use: Yes    Alcohol/week: 1.0 standard drink    Types: 1 Glasses of wine per week    Comment: occasionally  . Drug use: No    Home Medications Prior to Admission medications    Medication Sig Start Date End Date Taking? Authorizing Provider  azithromycin (ZITHROMAX) 250 MG tablet Take 2 tabs PO x 1 dose, then 1 tab PO QD x 4 days 01/07/21   Bing Neighbors, FNP  buPROPion (WELLBUTRIN XL) 150 MG 24 hr tablet Take 450 mg by mouth daily. Patient is to take 3 tablets every morning.    [provider]  carbamide peroxide (DEBROX) 6.5 % OTIC solution Place 5 drops into the right ear 2 (two) times daily as needed (5 days. may repeat as needed). 01/07/21   Bing Neighbors, FNP  carvedilol (COREG) 6.25 MG tablet Take by mouth. 03/29/20   [provider]  FLUoxetine (PROZAC) 20 MG capsule Take 20 mg by mouth daily.    [provider]  furosemide (LASIX) 20 MG tablet Take 1 tablet by mouth daily. 12/10/20   [provider]  ibuprofen (ADVIL,MOTRIN) 400 MG tablet Take 400 mg by mouth every 6 (six) hours as needed.    [provider]  losartan (COZAAR) 50 MG tablet 1 tab(s)    [provider]  omeprazole (PRILOSEC) 20 MG capsule 1 cap(s) 07/18/19   [provider]  ondansetron (ZOFRAN ODT) 4 MG disintegrating tablet Take one tab by mouth Q6hr prn nausea.  Dissolve under tongue. 06/25/18   Lattie Haw, MD  ondansetron (ZOFRAN) 4 MG tablet Take 1  tablet (4 mg total) by mouth every 6 (six) hours. 09/05/12   Gwyneth Sprout, MD  potassium chloride (KLOR-CON) 10 MEQ tablet Take by mouth. 06/29/20   [provider]  Pseudoeph-Doxylamine-DM-APAP (NYQUIL PO) Take 2 capsules by mouth daily as needed. For cough and congestion.    [provider]    Allergies    Amoxicillin and Penicillins  Review of Systems   Review of Systems  Respiratory: Negative for shortness of breath.   Cardiovascular: Positive for chest pain.  Gastrointestinal: Positive for heartburn and nausea. Negative for abdominal pain.  Neurological: Positive for weakness.  All other systems reviewed and are negative.   Physical  Exam Updated Vital Signs BP (!) 155/101 (BP Location: Left Arm)   Pulse 87   Temp 98.6 F (37 C) (Oral)   Resp 18   SpO2 99%   Physical Exam Vitals and nursing note reviewed.  Constitutional:      General: She is not in acute distress.    Appearance: She is not ill-appearing.  HENT:     Head: Normocephalic.     Mouth/Throat:     Pharynx: Oropharynx is clear.  Eyes:     Conjunctiva/sclera: Conjunctivae normal.  Cardiovascular:     Rate and Rhythm: Normal rate and regular rhythm.     Pulses: Normal pulses.     Heart sounds: Normal heart sounds.  Pulmonary:     Effort: Pulmonary effort is normal. No respiratory distress.     Breath sounds: Normal breath sounds.  Abdominal:     Palpations: Abdomen is soft.  Musculoskeletal:        General: Normal range of motion.     Right lower leg: No edema.     Left lower leg: No edema.  Skin:    General: Skin is warm and dry.  Neurological:     Mental Status: She is alert and oriented to person, place, and time.  Psychiatric:        Mood and Affect: Mood normal.        Behavior: Behavior normal.     ED Results / Procedures / Treatments   Labs (all labs ordered are listed, but only abnormal results are displayed) Labs Reviewed  BASIC METABOLIC PANEL - Abnormal; Notable for the following components:      Result Value   Sodium 133 (*)    Glucose, Bld 109 (*)    All other components within normal limits  CBC - Abnormal; Notable for the following components:   Hemoglobin 11.8 (*)    All other components within normal limits  TROPONIN I (HIGH SENSITIVITY)  TROPONIN I (HIGH SENSITIVITY)    EKG EKG Interpretation  Date/Time:  Wednesday January 26 2021 17:42:06 EDT Ventricular Rate:  84 PR Interval:  156 QRS Duration: 144 QT Interval:  434 QTC Calculation: 512 R Axis:   -15 Text Interpretation: Normal sinus rhythm Left bundle branch block Abnormal ECG Confirmed by Gilda Crease [phone]) on 01/26/2021 11:35:02  PM   Radiology DG Chest 2 View  Result Date: 01/26/2021 CLINICAL DATA:  Chest pain, nausea, and shortness of breath. EXAM: CHEST - 2 VIEW COMPARISON:  03/24/2018 FINDINGS: The cardiomediastinal contours are normal. The lungs are clear. Pulmonary vasculature is normal. No consolidation, pleural effusion, or pneumothorax. No acute osseous abnormalities are seen. IMPRESSION: Negative radiographs of the chest. Electronically Signed   By: Narda Rutherford M.D.   On: 01/26/2021 18:37    Procedures Procedures   Medications Ordered in ED Medications -  No data to display  ED Course  I have reviewed the triage vital signs and the nursing notes.  Pertinent labs & imaging results that were available during my care of the patient were reviewed by me and considered in my medical decision making (see chart for details).    MDM Rules/Calculators/A&P                          Patient is to be discharged with recommendation to follow up with PCP in regards to today's hospital visit. Chest pain is not likely of cardiac or pulmonary etiology d/t presentation, perc negative, VSS, no tracheal deviation, no JVD or new murmur, RRR, breath sounds equal bilaterally, EKG without acute abnormalities (known LBBB, appropriate concordance), negative troponin, and negative CXR. Pt is on omeprazole. Patient advised to return to the ED is CP becomes exertional, worsens or becomes concerning in any way. Pt appears reliable for follow up and is agreeable to discharge.   Case has been discussed with Dr. Blinda Leatherwood, who agrees with the above plan to discharge.  Final Clinical Impression(s) / ED Diagnoses Final diagnoses:  Chest pain    Rx / DC Orders ED Discharge Orders    None       Felicie Morn, NP 01/26/21 2345    Felicie Morn, NP 01/26/21 2347    Gilda Crease, MD 01/27/21 [phone]

## 2021-01-26 NOTE — ED Triage Notes (Signed)
Pt  Here from work with c/o cp middle radiates down left arm , 324mg  asa given by ems

## 2021-01-26 NOTE — Discharge Instructions (Signed)
Please follow up with your primary care provider as discussed

## 2022-06-23 ENCOUNTER — Ambulatory Visit
Admission: EM | Admit: 2022-06-23 | Discharge: 2022-06-23 | Disposition: A | Discharge status: Home / Self Care | Payer: No Typology Code available for payment source

## 2022-06-23 ENCOUNTER — Encounter: Payer: Self-pay | Admitting: Emergency Medicine

## 2022-06-23 ENCOUNTER — Telehealth: Payer: Self-pay | Admitting: Urgent Care

## 2022-06-23 ENCOUNTER — Other Ambulatory Visit: Payer: Self-pay

## 2022-06-23 DIAGNOSIS — D509 Iron deficiency anemia, unspecified: Secondary | ICD-10-CM | POA: Diagnosis not present

## 2022-06-23 DIAGNOSIS — R42 Dizziness and giddiness: Secondary | ICD-10-CM | POA: Diagnosis not present

## 2022-06-23 DIAGNOSIS — R7309 Other abnormal glucose: Secondary | ICD-10-CM

## 2022-06-23 LAB — CBC WITH DIFFERENTIAL/PLATELET
Absolute Monocytes: 695 cells/uL (ref 200–950)
Basophils Absolute: 79 cells/uL (ref 0–200)
Basophils Relative: 1 %
Eosinophils Absolute: 158 cells/uL (ref 15–500)
Eosinophils Relative: 2 %
HCT: 37.3 % (ref 35.0–45.0)
Hemoglobin: 12.5 g/dL (ref 11.7–15.5)
Lymphs Abs: 1635 cells/uL (ref 850–3900)
MCH: 29.6 pg (ref 27.0–33.0)
MCHC: 33.5 g/dL (ref 32.0–36.0)
MCV: 88.2 fL (ref 80.0–100.0)
MPV: 9.1 fL (ref 7.5–12.5)
Monocytes Relative: 8.8 %
Neutro Abs: 5333 cells/uL (ref 1500–7800)
Neutrophils Relative %: 67.5 %
Platelets: 328 10*3/uL (ref 140–400)
RBC: 4.23 10*6/uL (ref 3.80–5.10)
RDW: 15.2 % — ABNORMAL HIGH (ref 11.0–15.0)
Total Lymphocyte: 20.7 %
WBC: 7.9 10*3/uL (ref 3.8–10.8)

## 2022-06-23 LAB — COMPREHENSIVE METABOLIC PANEL
AG Ratio: 1.2 (calc) (ref 1.0–2.5)
ALT: 56 U/L — ABNORMAL HIGH (ref 6–29)
AST: 37 U/L — ABNORMAL HIGH (ref 10–30)
Albumin: 4 g/dL (ref 3.6–5.1)
Alkaline phosphatase (APISO): 77 U/L (ref 31–125)
BUN: 12 mg/dL (ref 7–25)
CO2: 31 mmol/L (ref 20–32)
Calcium: 9.2 mg/dL (ref 8.6–10.2)
Chloride: 103 mmol/L (ref 98–110)
Creat: 0.72 mg/dL (ref 0.50–0.99)
Globulin: 3.4 g/dL (calc) (ref 1.9–3.7)
Glucose, Bld: 109 mg/dL — ABNORMAL HIGH (ref 65–99)
Potassium: 4.6 mmol/L (ref 3.5–5.3)
Sodium: 140 mmol/L (ref 135–146)
Total Bilirubin: 0.3 mg/dL (ref 0.2–1.2)
Total Protein: 7.4 g/dL (ref 6.1–8.1)

## 2022-06-23 LAB — POCT FASTING CBG KUC MANUAL ENTRY: POCT Glucose (KUC): 124 mg/dL — AB (ref 70–99)

## 2022-06-23 NOTE — ED Provider Notes (Signed)
[facility]    CSN: [id number] Arrival date & time: 06/23/22  0964      History   Chief Complaint Chief Complaint  Patient presents with   Dizziness    HPI Sherry Shaw is a 41 y.o. female.   Pleasant 41yo female with a known hx of CHF s/p pacemaker in Nov 2022 presents today due to having had a 15 minute episode of feeling "off" and slightly "dizzy or out of it" while driving to work this morning. Was at her baseline health upon entering her car. While driving, started feeling "spacey". States the symptoms lasted roughly 15 minutes, and are improving at the moment.  Denied any chest pain, shortness of breath, palpitations.  States she came in to "be cautious".  Was recently diagnosed with iron deficiency anemia.  Has been on iron, but reports running out 1 week ago.  She also states for the past several days she forgot to take her potassium.  She denies any muscle cramping.  While in our office, patient states her symptoms are nearly resolved.  Denied any weakness, slurred speech, vision change, gait instability.  Had a headache at symptom onset, but this also has dissipated.   Dizziness   Past Medical History:  Diagnosis Date   CHF (congestive heart failure) (HCC)    Depression    History of heartburn     Patient Active Problem List   Diagnosis Date Noted   DYSPAREUNIA 02/15/2009   ALOPECIA 01/27/2009   VERTIGO 01/27/2009   FATIGUE 01/27/2009    Past Surgical History:  Procedure Laterality Date   CESAREAN SECTION     LAPAROSCOPY  04/25/2012   Procedure: LAPAROSCOPY DIAGNOSTIC;  Surgeon: Meriel Pica, MD;  Location: [facility];  Service: Gynecology;  Laterality: N/A;    OB History   No obstetric history on file.      Home Medications    Prior to Admission medications   Medication Sig Start Date End Date Taking? Authorizing Provider  carvedilol (COREG) 6.25 MG tablet Take by mouth. 03/29/20  Yes [provider]  cyanocobalamin (VITAMIN  B12) 500 MCG tablet Take 1 tablet by mouth daily.   Yes [provider]  Ferrous Sulfate 140 (45 Fe) MG TBCR Take 1 tablet by mouth daily.   Yes [provider]  furosemide (LASIX) 20 MG tablet Take 1 tablet by mouth daily. 12/10/20  Yes [provider]  omeprazole (PRILOSEC) 20 MG capsule 1 cap(s) 07/18/19  Yes [provider]  potassium chloride (KLOR-CON) 10 MEQ tablet Take by mouth. 06/29/20  Yes [provider]  sacubitril-valsartan (ENTRESTO) 24-26 MG Take 1 tablet by mouth 2 (two) times daily. 03/29/22  Yes [provider]  azithromycin (ZITHROMAX) 250 MG tablet Take 2 tabs PO x 1 dose, then 1 tab PO QD x 4 days 01/07/21   Bing Neighbors, FNP  buPROPion (WELLBUTRIN XL) 150 MG 24 hr tablet Take 450 mg by mouth daily. Patient is to take 3 tablets every morning.    [provider]  carbamide peroxide (DEBROX) 6.5 % OTIC solution Place 5 drops into the right ear 2 (two) times daily as needed (5 days. may repeat as needed). 01/07/21   Bing Neighbors, FNP  FLUoxetine (PROZAC) 20 MG capsule Take 20 mg by mouth daily.    [provider]  ibuprofen (ADVIL,MOTRIN) 400 MG tablet Take 400 mg by mouth every 6 (six) hours as needed.    [provider]  losartan (  COZAAR) 50 MG tablet 1 tab(s)    [provider]  ondansetron (ZOFRAN ODT) 4 MG disintegrating tablet Take one tab by mouth Q6hr prn nausea.  Dissolve under tongue. 06/25/18   Lattie HawBeese, Stephen A, MD  ondansetron (ZOFRAN) 4 MG tablet Take 1 tablet (4 mg total) by mouth every 6 (six) hours. 09/05/12   Gwyneth SproutPlunkett, Derren Suydam, MD  Pseudoeph-Doxylamine-DM-APAP (NYQUIL PO) Take 2 capsules by mouth daily as needed. For cough and congestion.    [provider]    Family History Family History  Family history unknown: Yes    Social History Social History   Tobacco Use   Smoking status: Never   Smokeless tobacco: Never  Vaping Use   Vaping Use: Never used   Substance Use Topics   Alcohol use: Yes    Alcohol/week: 1.0 standard drink of alcohol    Types: 1 Glasses of wine per week    Comment: occasionally   Drug use: No     Allergies   Amoxicillin and Penicillins   Review of Systems Review of Systems  Neurological:  Positive for dizziness.  As per HPI   Physical Exam Triage Vital Signs ED Triage Vitals  Enc Vitals Group     BP 06/23/22 0829 (!) 139/98     Pulse Rate 06/23/22 0829 97     Resp 06/23/22 0829 18     Temp 06/23/22 0829 98.8 F (37.1 C)     Temp Source 06/23/22 0829 Oral     SpO2 06/23/22 0829 97 %     Weight 06/23/22 0831 218 lb (98.9 kg)     Height 06/23/22 0831 5\' 2"  (1.575 m)     Head Circumference --      Peak Flow --      Pain Score 06/23/22 0830 0     Pain Loc --      Pain Edu? --      Excl. in GC? --    No data found.  Updated Vital Signs BP (!) 139/98 (BP Location: Right Arm)   Pulse 97   Temp 98.8 F (37.1 C) (Oral)   Resp 18   Ht 5\' 2"  (1.575 m)   Wt 218 lb (98.9 kg)   LMP 06/18/2022   SpO2 97%   BMI 39.87 kg/m   Visual Acuity Right Eye Distance:   Left Eye Distance:   Bilateral Distance:    Right Eye Near:   Left Eye Near:    Bilateral Near:     Physical Exam Vitals and nursing note reviewed.  Constitutional:      General: She is not in acute distress.    Appearance: Normal appearance. She is well-developed. She is obese. She is not ill-appearing, toxic-appearing or diaphoretic.  HENT:     Head: Normocephalic and atraumatic.     Right Ear: Tympanic membrane, ear canal and external ear normal.     Left Ear: Tympanic membrane, ear canal and external ear normal.     Nose: Nose normal.     Mouth/Throat:     Mouth: Mucous membranes are moist.     Pharynx: Oropharynx is clear. No oropharyngeal exudate or posterior oropharyngeal erythema.  Eyes:     General: No scleral icterus.       Right eye: No discharge.        Left eye: No discharge.     Extraocular Movements:  Extraocular movements intact.     Pupils: Pupils are equal, round, and reactive to light.  Comments: Mild conjunctival pallor  Cardiovascular:     Rate and Rhythm: Normal rate and regular rhythm.     Pulses: Normal pulses.     Heart sounds: No murmur heard. Pulmonary:     Effort: Pulmonary effort is normal. No respiratory distress.     Breath sounds: Normal breath sounds.  Abdominal:     Palpations: Abdomen is soft.     Tenderness: There is no abdominal tenderness.  Musculoskeletal:        General: No swelling.     Cervical back: Normal range of motion and neck supple. No rigidity or tenderness.  Lymphadenopathy:     Cervical: No cervical adenopathy.  Skin:    General: Skin is warm and dry.     Capillary Refill: Capillary refill takes less than 2 seconds.  Neurological:     General: No focal deficit present.     Mental Status: She is alert and oriented to person, place, and time. Mental status is at baseline.     Cranial Nerves: Cranial nerves 2-12 are intact. No cranial nerve deficit or facial asymmetry.     Sensory: Sensation is intact.     Motor: Motor function is intact. No weakness, tremor, atrophy or pronator drift.     Coordination: Coordination is intact. Romberg sign negative. Coordination normal. Finger-Nose-Finger Test normal.     Gait: Gait is intact.     Deep Tendon Reflexes: Reflexes are normal and symmetric.  Psychiatric:        Mood and Affect: Mood normal.      UC Treatments / Results  Labs (all labs ordered are listed, but only abnormal results are displayed) Labs Reviewed  POCT FASTING CBG [facility] MANUAL ENTRY - Abnormal; Notable for the following components:      Result Value   POCT Glucose ([facility]) 124 (*)    All other components within normal limits  CBC WITH DIFFERENTIAL/PLATELET  COMPREHENSIVE METABOLIC PANEL    EKG   Radiology No results found.  Procedures ED EKG  Date/Time: 06/23/2022 10:03 AM  Performed by: Maretta Bees,  PA Authorized by: Maretta Bees, PA   Previous ECG:    Previous ECG:  Compared to current   Similarity:  No change   Comparison ECG info:  Pt's electrophysiologist compared to prior and reported no change Interpretation:    Details:  Atrial-sensed ventricular paced rhythm Rate:    ECG rate assessment: normal   Ectopy:    Ectopy: none    (including critical care time)  Medications Ordered in UC Medications - No data to display  Initial Impression / Assessment and Plan / UC Course  I have reviewed the triage vital signs and the nursing notes.  Pertinent labs & imaging results that were available during my care of the patient were reviewed by me and considered in my medical decision making (see chart for details).  Clinical Course as of 06/23/22 1002  Fri Jun 23, 2022  0942 Recheck BP prior to DC 128/92 [WC]    Clinical Course User Index [WC] Guy Sandifer L, PA    Dizziness -resolved in office.  Blood pressure rechecked prior to discharge and improvement noted.  Patient's electrophysiologist called back, and stated there has been no change in the EKG and he was very pleased with the results.  I do not suspect this to be cardiac in nature.  Patient's blood glucose was 124 fasting, she admits to being prediabetic.  I have drawn some blood work to assess  for anemia or electrolyte abnormality as a possible cause.  We will call patient with results of stat blood work once received.  Encouraged patient that should symptoms recur or worsen, had to the emergency room. Iron deficiency -will recheck CBC today.  Conjunctival pallor noted. Hgb 11.3 in June. Abnormal glucose -124 in office fasting.  Encouraged patient to follow-up with her PCP to discuss her prediabetes.  I do not suspect this is the cause of her symptoms today.  Pt requesting to leave to head to work given sx resolution. Will call with results of labs, pt understands to head to ER if any new or worsening symptoms  return.    Final Clinical Impressions(s) / UC Diagnoses   Final diagnoses:  Dizziness  Iron deficiency anemia, unspecified iron deficiency anemia type  Abnormal glucose     Discharge Instructions      I sent your EKG strip to your cardiologist for review to ensure there are no concerning changes compared to your EKG 3 months ago. Your blood sugar is mildly elevated, but this is likely not the cause of your symptoms this morning. We will call with the results of your labs once received to make any additional recommendations if needed.     ED Prescriptions   None    PDMP not reviewed this encounter.   Chaney Malling, [place] 06/23/22 1005

## 2022-06-23 NOTE — ED Triage Notes (Signed)
Patient c/o dizziness and feeling spaced out this morning as she was driving to work.  No chest pain or SOB.  Slight headache.  Denies any OTC pain meds.

## 2022-06-23 NOTE — Discharge Instructions (Signed)
I sent your EKG strip to your cardiologist for review to ensure there are no concerning changes compared to your EKG 3 months ago. Your blood sugar is mildly elevated, but this is likely not the cause of your symptoms this morning. We will call with the results of your labs once received to make any additional recommendations if needed.

## 2022-06-23 NOTE — Telephone Encounter (Signed)
Called pt at 3:12pm to notify of unremarkable lab results. Pt did not answer, no VM available to leave message

## 2022-06-27 ENCOUNTER — Telehealth: Payer: Self-pay | Admitting: Emergency Medicine

## 2022-06-27 NOTE — Telephone Encounter (Signed)
Patient called regarding her lab results, advised that labs were within normal limits.  Provider had attempted to contact patient but she was unavailable.  Patient voices understanding.

## 2022-10-18 IMAGING — CR DG CHEST 2V
2 series · 2 of 2 positions shown · non-contrast
Comparison: 03/24/2018

CLINICAL DATA: Chest pain, nausea, and shortness of breath.

EXAM:
CHEST - 2 VIEW

[chest pa]
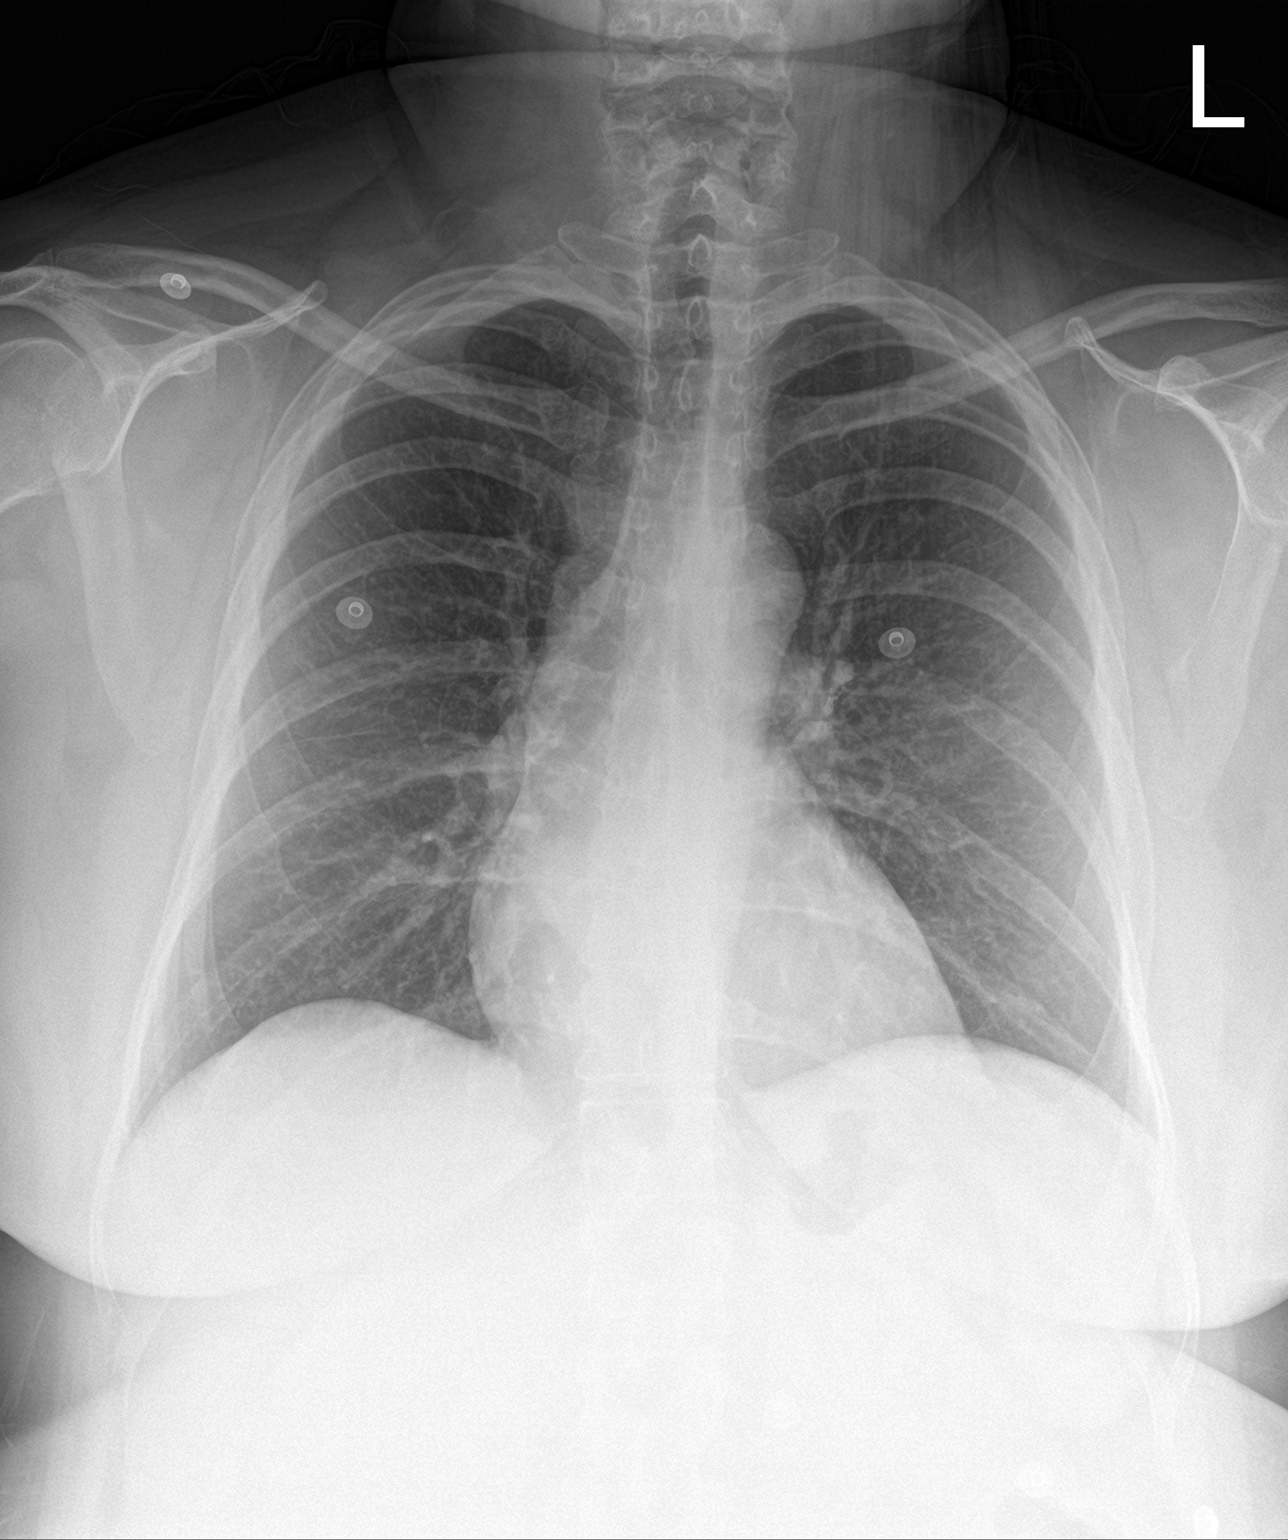

[chest lat]
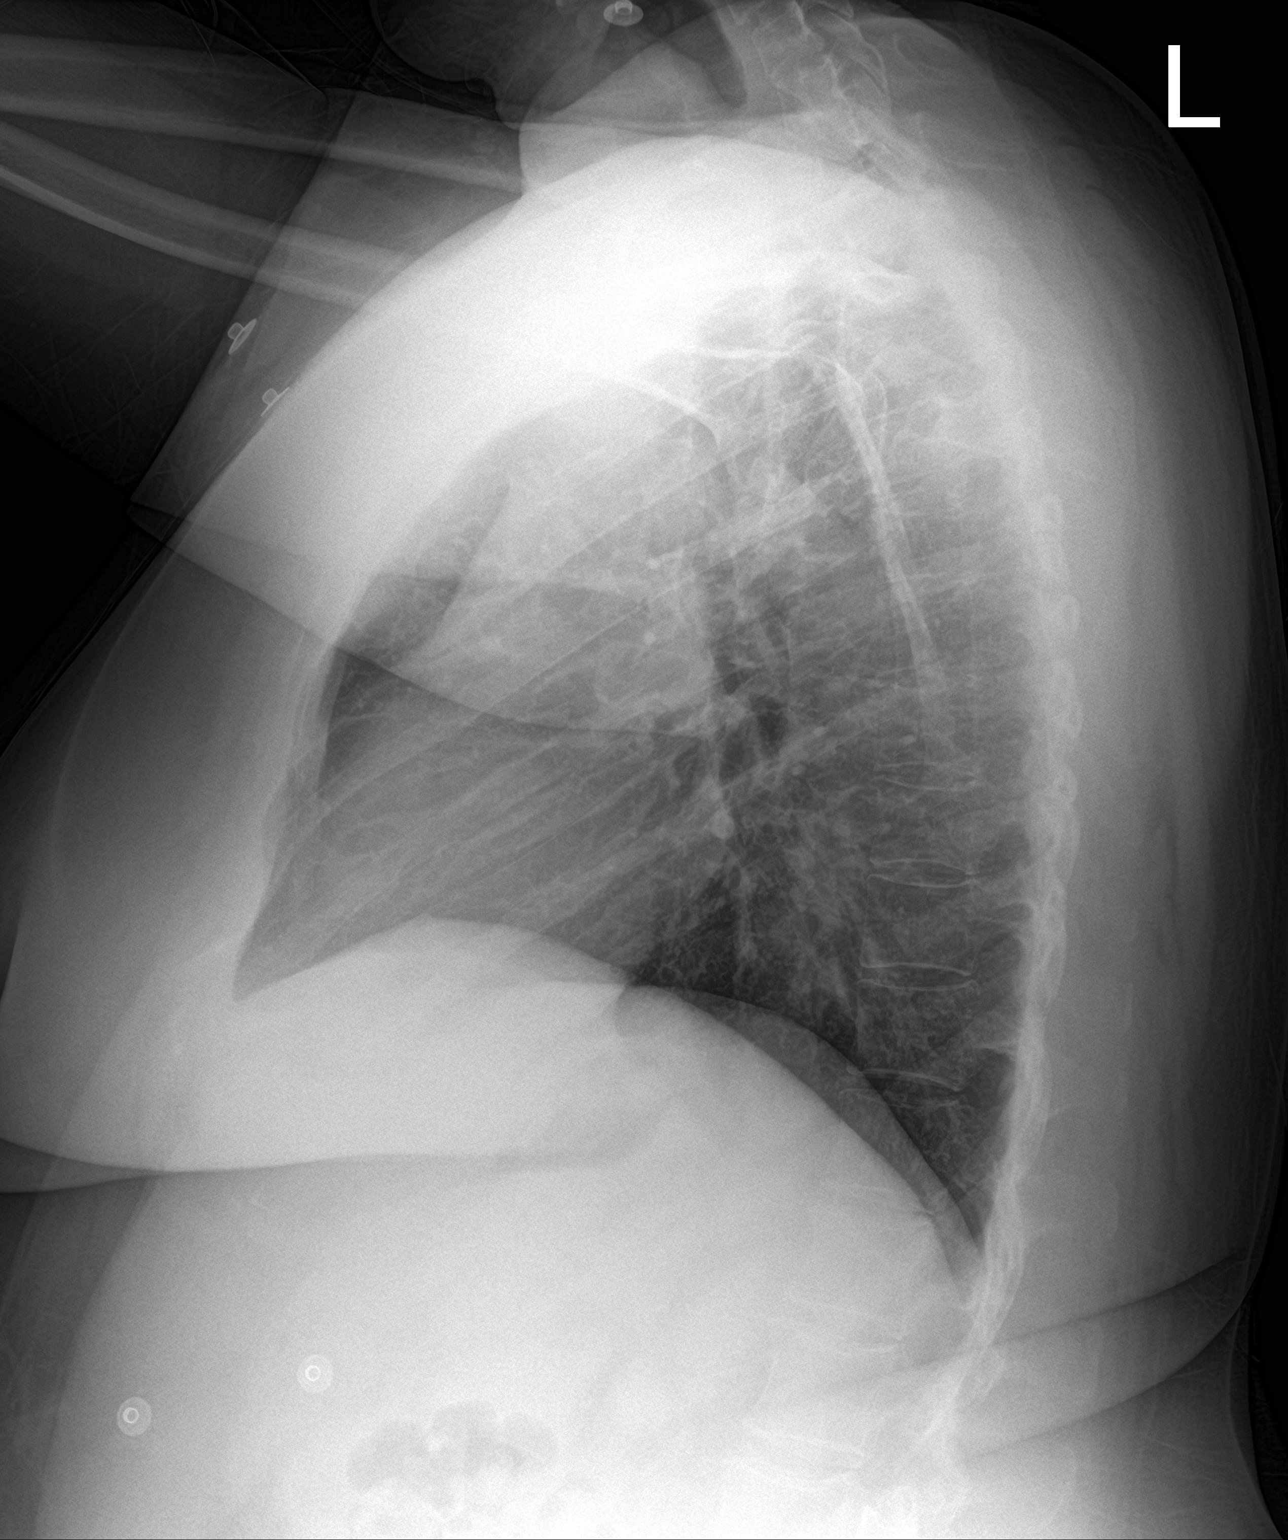

[2 of 2 positions shown; findings below may reference images not displayed]

FINDINGS: The cardiomediastinal contours are normal. The lungs are clear.
Pulmonary vasculature is normal. No consolidation, pleural effusion,
or pneumothorax. No acute osseous abnormalities are seen.
IMPRESSION: Negative radiographs of the chest.

## 2023-03-07 ENCOUNTER — Ambulatory Visit: Payer: No Typology Code available for payment source

## 2023-03-07 ENCOUNTER — Encounter: Payer: Self-pay | Admitting: Emergency Medicine

## 2023-03-07 ENCOUNTER — Other Ambulatory Visit: Payer: Self-pay

## 2023-03-07 ENCOUNTER — Ambulatory Visit
Admission: EM | Admit: 2023-03-07 | Discharge: 2023-03-07 | Disposition: A | Discharge status: Home / Self Care | Payer: No Typology Code available for payment source | Attending: Family Medicine | Admitting: Family Medicine

## 2023-03-07 DIAGNOSIS — R059 Cough, unspecified: Secondary | ICD-10-CM

## 2023-03-07 DIAGNOSIS — R0789 Other chest pain: Secondary | ICD-10-CM

## 2023-03-07 DIAGNOSIS — T148XXA Other injury of unspecified body region, initial encounter: Secondary | ICD-10-CM | POA: Diagnosis not present

## 2023-03-07 DIAGNOSIS — R0781 Pleurodynia: Secondary | ICD-10-CM

## 2023-03-07 MED ORDER — METHOCARBAMOL 500 MG PO TABS: 500.0000 mg | ORAL_TABLET | Freq: Three times a day (TID) | ORAL | 0 refills | Status: DC | PRN

## 2023-03-07 NOTE — ED Provider Notes (Signed)
[facility]    CSN: [id number] Arrival date & time: 03/07/23  1803      History   Chief Complaint Chief Complaint  Patient presents with   Cough    HPI Sherry Shaw is a 42 y.o. female.   HPI 42 year old female presents with left-sided rib pain worse when taking deep breath that started 2 AM this morning.  PMH significant for CHF, depression and obesity.  Past Medical History:  Diagnosis Date   CHF (congestive heart failure) (HCC)    Depression    History of heartburn     Patient Active Problem List   Diagnosis Date Noted   DYSPAREUNIA 02/15/2009   ALOPECIA 01/27/2009   VERTIGO 01/27/2009   FATIGUE 01/27/2009    Past Surgical History:  Procedure Laterality Date   CESAREAN SECTION     LAPAROSCOPY  04/25/2012   Procedure: LAPAROSCOPY DIAGNOSTIC;  Surgeon: Meriel Pica, MD;  Location: [facility];  Service: Gynecology;  Laterality: N/A;    OB History   No obstetric history on file.      Home Medications    Prior to Admission medications   Medication Sig Start Date End Date Taking? Authorizing Provider  carvedilol (COREG) 6.25 MG tablet Take by mouth. 03/29/20  Yes [provider]  cyanocobalamin (VITAMIN B12) 500 MCG tablet Take 1 tablet by mouth daily.   Yes [provider]  Ferrous Sulfate 140 (45 Fe) MG TBCR Take 1 tablet by mouth daily.   Yes [provider]  furosemide (LASIX) 20 MG tablet Take 1 tablet by mouth daily. 12/10/20  Yes [provider]  ibuprofen (ADVIL,MOTRIN) 400 MG tablet Take 400 mg by mouth every 6 (six) hours as needed.   Yes [provider]  methocarbamol (ROBAXIN) 500 MG tablet Take 1 tablet (500 mg total) by mouth 3 (three) times daily as needed. 03/07/23  Yes Trevor Iha, FNP  omeprazole (PRILOSEC) 20 MG capsule 1 cap(s) 07/18/19  Yes [provider]  potassium chloride (KLOR-CON) 10 MEQ tablet Take by mouth. 06/29/20  Yes [provider]  sacubitril-valsartan  (ENTRESTO) 24-26 MG Take 1 tablet by mouth 2 (two) times daily. 03/29/22  Yes [provider]  carbamide peroxide (DEBROX) 6.5 % OTIC solution Place 5 drops into the right ear 2 (two) times daily as needed (5 days. may repeat as needed). 01/07/21   Bing Neighbors, NP  FLUoxetine (PROZAC) 20 MG capsule Take 20 mg by mouth daily.    [provider]  losartan (COZAAR) 50 MG tablet 1 tab(s)    [provider]  ondansetron (ZOFRAN ODT) 4 MG disintegrating tablet Take one tab by mouth Q6hr prn nausea.  Dissolve under tongue. 06/25/18   Lattie Haw, MD  ondansetron (ZOFRAN) 4 MG tablet Take 1 tablet (4 mg total) by mouth every 6 (six) hours. 09/05/12   Gwyneth Sprout, MD    Family History Family History  Family history unknown: Yes    Social History Social History   Tobacco Use   Smoking status: Never   Smokeless tobacco: Never  Vaping Use   Vaping Use: Never used  Substance Use Topics   Alcohol use: Yes    Alcohol/week: 1.0 standard drink of alcohol    Types: 1 Glasses of wine per week    Comment: occasionally   Drug use: No     Allergies   Amoxicillin and Penicillins   Review of Systems Review of Systems   Physical Exam Triage Vital  Signs ED Triage Vitals  Enc Vitals Group     BP 03/07/23 1908 134/89     Pulse Rate 03/07/23 1908 87     Resp 03/07/23 1908 16     Temp 03/07/23 1908 98.3 F (36.8 C)     Temp Source 03/07/23 1908 Oral     SpO2 03/07/23 1908 96 %     Weight 03/07/23 1910 219 lb (99.3 kg)     Height 03/07/23 1910 5\' 2"  (1.575 m)     Head Circumference --      Peak Flow --      Pain Score 03/07/23 1910 7     Pain Loc --      Pain Edu? --      Excl. in GC? --    No data found.  Updated Vital Signs BP 134/89 (BP Location: Right Arm)   Pulse 87   Temp 98.3 F (36.8 C) (Oral)   Resp 16   Ht 5\' 2"  (1.575 m)   Wt 219 lb (99.3 kg)   LMP 02/28/2023   SpO2 96%   BMI 40.06 kg/m    Physical Exam Vitals and nursing  note reviewed.  Constitutional:      Appearance: Normal appearance. She is obese.  HENT:     Head: Normocephalic and atraumatic.     Right Ear: Tympanic membrane, ear canal and external ear normal.     Left Ear: Tympanic membrane, ear canal and external ear normal.     Mouth/Throat:     Mouth: Mucous membranes are moist.     Pharynx: Oropharynx is clear.  Eyes:     Extraocular Movements: Extraocular movements intact.     Conjunctiva/sclera: Conjunctivae normal.     Pupils: Pupils are equal, round, and reactive to light.  Cardiovascular:     Rate and Rhythm: Normal rate and regular rhythm.     Pulses: Normal pulses.     Heart sounds: Normal heart sounds.  Pulmonary:     Effort: Pulmonary effort is normal.     Breath sounds: Normal breath sounds. No wheezing, rhonchi or rales.  Musculoskeletal:        General: Normal range of motion.     Cervical back: Normal range of motion and neck supple.     Comments: Left-sided anterior rib lateral aspect mildly TTP  Skin:    General: Skin is warm and dry.  Neurological:     General: No focal deficit present.     Mental Status: She is alert and oriented to person, place, and time. Mental status is at baseline.  Psychiatric:        Mood and Affect: Mood normal.        Behavior: Behavior normal.      UC Treatments / Results  Labs (all labs ordered are listed, but only abnormal results are displayed) Labs Reviewed - No data to display  EKG   Radiology DG Ribs Unilateral W/Chest Left  Result Date: 03/07/2023 CLINICAL DATA:  Cough and left-sided rib pain worse with deep breathing. EXAM: LEFT RIBS AND CHEST - 3+ VIEW COMPARISON:  Chest radiograph 01/26/2021. Chest and left ribs 03/24/2018 FINDINGS: Cardiac pacemaker. Normal heart size and pulmonary vascularity. No focal airspace disease or consolidation in the lungs. No blunting of costophrenic angles. No pneumothorax. Mediastinal contours appear intact. Left ribs appear intact. No  evidence of acute fracture or displacement. No focal bone lesion or bone destruction. Soft tissues are unremarkable. IMPRESSION: 1. No evidence of active pulmonary  disease. 2. Negative left ribs. Electronically Signed   By: Burman Nieves M.D.   On: 03/07/2023 19:28    Procedures Procedures (including critical care time)  Medications Ordered in UC Medications - No data to display  Initial Impression / Assessment and Plan / UC Course  I have reviewed the triage vital signs and the nursing notes.  Pertinent labs & imaging results that were available during my care of the patient were reviewed by me and considered in my medical decision making (see chart for details).     MDM: 1.  Rib pain on left side-left-sided rib x-ray revealed above; 2.  Muscle strain-Rx'd Robaxin 500 mg 3 times daily, as needed for left-sided anterior muscle strain of rib area. Advised patient chest x-ray revealed no rib fracture or cardiopulmonary disease.  Advised may use Robaxin daily or as needed for muscle strain of left rib area.  Encouraged increase daily water intake while taking this medication.  Advised patient if symptoms worsen and/or unresolved please follow-up with PCP or here for further evaluation.  Work note provided to patient prior to discharge per request. Final Clinical Impressions(s) / UC Diagnoses   Final diagnoses:  Rib pain on left side  Muscle strain     Discharge Instructions      Advised patient chest x-ray revealed no rib fracture or cardiopulmonary disease.  Advised may use Robaxin daily or as needed for muscle strain of left rib area.  Encouraged increase daily water intake while taking this medication.  Advised patient if symptoms worsen and/or unresolved please follow-up with PCP or here for further evaluation.     ED Prescriptions     Medication Sig Dispense Auth. Provider   methocarbamol (ROBAXIN) 500 MG tablet Take 1 tablet (500 mg total) by mouth 3 (three) times daily as  needed. 21 tablet Trevor Iha, FNP      PDMP not reviewed this encounter.   Trevor Iha, FNP 03/07/23 1947

## 2023-03-07 NOTE — ED Triage Notes (Signed)
Patient c/o cough and left sided rib pain that worsens when she take a deep breathe.  It all started about 2am this morning.  Cough has subsided, but the pain is still there.  Some relief with Ibuprofen but pain is still there.  Denies any injury to the area.

## 2023-03-07 NOTE — Discharge Instructions (Addendum)
Advised patient chest x-ray revealed no rib fracture or cardiopulmonary disease.  Advised may use Robaxin daily or as needed for muscle strain of left rib area.  Encouraged increase daily water intake while taking this medication.  Advised patient if symptoms worsen and/or unresolved please follow-up with PCP or here for further evaluation.

## 2023-10-25 ENCOUNTER — Ambulatory Visit
Admission: EM | Admit: 2023-10-25 | Discharge: 2023-10-25 | Disposition: A | Discharge status: Home / Self Care | Payer: No Typology Code available for payment source | Attending: Emergency Medicine | Admitting: Emergency Medicine

## 2023-10-25 ENCOUNTER — Ambulatory Visit: Payer: No Typology Code available for payment source

## 2023-10-25 DIAGNOSIS — W19XXXA Unspecified fall, initial encounter: Secondary | ICD-10-CM

## 2023-10-25 DIAGNOSIS — S93401A Sprain of unspecified ligament of right ankle, initial encounter: Secondary | ICD-10-CM

## 2023-10-25 DIAGNOSIS — M79671 Pain in right foot: Secondary | ICD-10-CM

## 2023-10-25 DIAGNOSIS — S8254XA Nondisplaced fracture of medial malleolus of right tibia, initial encounter for closed fracture: Secondary | ICD-10-CM | POA: Diagnosis not present

## 2023-10-25 DIAGNOSIS — M25571 Pain in right ankle and joints of right foot: Secondary | ICD-10-CM | POA: Diagnosis not present

## 2023-10-25 MED ORDER — CYCLOBENZAPRINE HCL 10 MG PO TABS: 10.0000 mg | ORAL_TABLET | Freq: Two times a day (BID) | ORAL | 0 refills | Status: AC | PRN

## 2023-10-25 MED ORDER — IBUPROFEN 800 MG PO TABS: 800.0000 mg | ORAL_TABLET | Freq: Three times a day (TID) | ORAL | 0 refills | Status: AC

## 2023-10-25 MED ORDER — ACETAMINOPHEN 325 MG PO TABS
650.0000 mg | ORAL_TABLET | Freq: Once | ORAL | Status: AC
  Administered 2023-10-25: 650 mg via ORAL

## 2023-10-25 NOTE — ED Triage Notes (Signed)
Pt c/o RT foot and ankle pain since this am. Says she was in her restroom when her foot buckled. Painful to bare weight. Ibuprofen prn.

## 2023-10-25 NOTE — Discharge Instructions (Addendum)
Please wear the boot continually, and use crutches to be NON WEIGHT BEARING until you can follow up with foot specialist. Please do not take the boot off.  Ibuprofen 800 mg every 6 hours  You can take the muscle relaxer Flexeril twice daily. If the medication makes you drowsy, take only at bed time.  Follow up with orthopedics as soon as able

## 2023-10-25 NOTE — ED Provider Notes (Signed)
[facility]    CSN: [id number] Arrival date & time: 10/25/23  1543      History   Chief Complaint Chief Complaint  Patient presents with   Ankle Pain    RT   Foot Pain    HPI Sherry Shaw is a 43 y.o. female.  Presents with right foot and ankle injury that occurred this morning Foot seemed to buckle and twist. She fell onto her back. Having 7/10 pain, worse with weight bearing. Mostly outer ankle and foot. Low back is sore as well. Tried ibuprofen  No prior injury known   Past Medical History:  Diagnosis Date   CHF (congestive heart failure) (HCC)    Depression    History of heartburn     Patient Active Problem List   Diagnosis Date Noted   DYSPAREUNIA 02/15/2009   Alopecia 01/27/2009   VERTIGO 01/27/2009   FATIGUE 01/27/2009    Past Surgical History:  Procedure Laterality Date   CESAREAN SECTION     LAPAROSCOPY  04/25/2012   Procedure: LAPAROSCOPY DIAGNOSTIC;  Surgeon: Meriel Pica, MD;  Location: [facility];  Service: Gynecology;  Laterality: N/A;    OB History   No obstetric history on file.      Home Medications    Prior to Admission medications   Medication Sig Start Date End Date Taking? Authorizing Provider  cyclobenzaprine (FLEXERIL) 10 MG tablet Take 1 tablet (10 mg total) by mouth 2 (two) times daily as needed for muscle spasms. 10/25/23  Yes Toshua Honsinger, Lurena Joiner, PA-C  ibuprofen (ADVIL) 800 MG tablet Take 1 tablet (800 mg total) by mouth 3 (three) times daily. 10/25/23  Yes Tynisa Vohs, Lurena Joiner, PA-C  carbamide peroxide (DEBROX) 6.5 % OTIC solution Place 5 drops into the right ear 2 (two) times daily as needed (5 days. may repeat as needed). 01/07/21   Bing Neighbors, NP  carvedilol (COREG) 6.25 MG tablet Take by mouth. 03/29/20   [provider]  cyanocobalamin (VITAMIN B12) 500 MCG tablet Take 1 tablet by mouth daily.    [provider]  FLUoxetine (PROZAC) 20 MG capsule Take 20 mg by mouth daily.    [provider]  furosemide (LASIX) 20 MG tablet Take 1 tablet by mouth daily. 12/10/20   [provider]  omeprazole (PRILOSEC) 20 MG capsule 1 cap(s) 07/18/19   [provider]  ondansetron (ZOFRAN ODT) 4 MG disintegrating tablet Take one tab by mouth Q6hr prn nausea.  Dissolve under tongue. 06/25/18   Lattie Haw, MD  ondansetron (ZOFRAN) 4 MG tablet Take 1 tablet (4 mg total) by mouth every 6 (six) hours. 09/05/12   Gwyneth Sprout, MD  potassium chloride (KLOR-CON) 10 MEQ tablet Take by mouth. 06/29/20   [provider]  sacubitril-valsartan (ENTRESTO) 24-26 MG Take 1 tablet by mouth 2 (two) times daily. 03/29/22   [provider]    Family History Family History  Family history unknown: Yes    Social History Social History   Tobacco Use   Smoking status: Never   Smokeless tobacco: Never  Vaping Use   Vaping status: Never Used  Substance Use Topics   Alcohol use: Yes    Alcohol/week: 1.0 standard drink of alcohol    Types: 1 Glasses of wine per week    Comment: occasionally   Drug use: No     Allergies   Amoxicillin and Penicillins   Review of Systems Review of Systems As per HPI  Physical Exam Triage  Vital Signs ED Triage Vitals [10/25/23 1553]  Encounter Vitals Group     BP      Systolic BP Percentile      Diastolic BP Percentile      Pulse      Resp      Temp      Temp src      SpO2      Weight      Height      Head Circumference      Peak Flow      Pain Score 7     Pain Loc      Pain Education      Exclude from Growth Chart    No data found.  Updated Vital Signs BP (!) 158/101 (BP Location: Right Arm)   Pulse 96   Temp (!) 97.5 F (36.4 C) (Oral)   Resp 17   LMP 09/25/2023 (Approximate)   SpO2 98%    Physical Exam Vitals and nursing note reviewed.  Constitutional:      General: She is not in acute distress. Cardiovascular:     Rate and Rhythm: Normal rate and regular rhythm.     Pulses: Normal pulses.      Heart sounds: Normal heart sounds.  Pulmonary:     Effort: Pulmonary effort is normal.     Breath sounds: Normal breath sounds.  Musculoskeletal:        General: Tenderness present.     Cervical back: Normal range of motion.     Lumbar back: Tenderness present.     Right ankle: Tenderness present. Decreased range of motion. Normal pulse.     Comments: Tender lateral ankle and foot. Non tender over medial malleolus. DP pulse 2+, distal sensation intact. Cap refill < 2 seconds   Skin:    General: Skin is warm and dry.     Capillary Refill: Capillary refill takes less than 2 seconds.     Findings: No bruising or erythema.  Neurological:     Mental Status: She is alert and oriented to person, place, and time.     UC Treatments / Results  Labs (all labs ordered are listed, but only abnormal results are displayed) Labs Reviewed - No data to display  EKG   Radiology DG Ankle Complete Right Result Date: 10/25/2023 CLINICAL DATA:  Larey Seat in bathroom this morning and twisted right ankle and foot. Lateral pain. EXAM: RIGHT ANKLE - COMPLETE 3+ VIEW; RIGHT FOOT COMPLETE - 3+ VIEW COMPARISON:  None Available. FINDINGS: Right ankle: The ankle mortise is symmetric and intact. There is minimal transverse lucency at the distal tip of the medial malleolus, suspicious for an acute nondisplaced fracture. Minimal chronic enthesopathic change at the Achilles insertion on the calcaneus. Mild dorsal naviculocuneiform and minimal posterior dorsal navicular degenerative osteophytosis. Right foot: Joint spaces are preserved. Borderline mild hallux valgus with great toe metatarsophalangeal angle measuring 15 degrees. No acute fracture or dislocation. IMPRESSION: 1. Minimal transverse lucency at the distal tip of the medial malleolus, suspicious for an acute nondisplaced fracture. 2. Borderline mild hallux valgus. Electronically Signed   By: Neita Garnet M.D.   On: 10/25/2023 16:25   DG Foot Complete Right Result  Date: 10/25/2023 CLINICAL DATA:  Larey Seat in bathroom this morning and twisted right ankle and foot. Lateral pain. EXAM: RIGHT ANKLE - COMPLETE 3+ VIEW; RIGHT FOOT COMPLETE - 3+ VIEW COMPARISON:  None Available. FINDINGS: Right ankle: The ankle mortise is symmetric and intact. There is minimal transverse lucency  at the distal tip of the medial malleolus, suspicious for an acute nondisplaced fracture. Minimal chronic enthesopathic change at the Achilles insertion on the calcaneus. Mild dorsal naviculocuneiform and minimal posterior dorsal navicular degenerative osteophytosis. Right foot: Joint spaces are preserved. Borderline mild hallux valgus with great toe metatarsophalangeal angle measuring 15 degrees. No acute fracture or dislocation. IMPRESSION: 1. Minimal transverse lucency at the distal tip of the medial malleolus, suspicious for an acute nondisplaced fracture. 2. Borderline mild hallux valgus. Electronically Signed   By: Neita Garnet M.D.   On: 10/25/2023 16:25    Procedures Procedures   Medications Ordered in UC Medications  acetaminophen (TYLENOL) tablet 650 mg (650 mg Oral Given 10/25/23 1619)    Initial Impression / Assessment and Plan / UC Course  I have reviewed the triage vital signs and the nursing notes.  Pertinent labs & imaging results that were available during my care of the patient were reviewed by me and considered in my medical decision making (see chart for details).  Tylenol dose given for pain Xray of right ankle with small lucency at distal medial malleolus concerning for fracture. Oddly she is non tender in this area, and all pain is lateral. Suspect a sprain as well. CAM boot and crutches provided. She is to be non-weight bearing until follow up with orthopedics. Ibu and tylenol for pain, try flexeril BID for back. 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 A work note is provided   Final Clinical Impressions(s) / UC  Diagnoses   Final diagnoses:  Sprain of right ankle, unspecified ligament, initial encounter  Closed nondisplaced fracture of medial malleolus of right tibia, initial encounter     Discharge Instructions      Please wear the boot continually, and use crutches to be NON WEIGHT BEARING until you can follow up with foot specialist. Please do not take the boot off.  Ibuprofen 800 mg every 6 hours  You can take the muscle relaxer Flexeril twice daily. If the medication makes you drowsy, take only at bed time.  Follow up with orthopedics as soon as able      ED Prescriptions     Medication Sig Dispense Auth. Provider   cyclobenzaprine (FLEXERIL) 10 MG tablet Take 1 tablet (10 mg total) by mouth 2 (two) times daily as needed for muscle spasms. 20 tablet Frederika Hukill, PA-C   ibuprofen (ADVIL) 800 MG tablet Take 1 tablet (800 mg total) by mouth 3 (three) times daily. 21 tablet [place] Hijazi, Lurena Joiner, PA-C      PDMP not reviewed this encounter.   Marlow Baars, [place] 10/25/23 1656

## 2023-10-26 ENCOUNTER — Telehealth: Payer: Self-pay | Admitting: Emergency Medicine

## 2023-10-26 NOTE — Telephone Encounter (Signed)
Returned patient's call regarding a work note. LMTRC.  A note was provided yesterday with discharge instructions.  If patient needs another note, please notify office.
